# Patient Record
Sex: Female | Born: 1990 | Race: White | Hispanic: No | Marital: Single | Smoking: Former smoker
Health system: Southern US, Community
[De-identification: ages and names within clinical notes are randomized; demographics above are authoritative.]

## PROBLEM LIST (undated history)

## (undated) DIAGNOSIS — J45909 Unspecified asthma, uncomplicated: Secondary | ICD-10-CM

## (undated) DIAGNOSIS — F419 Anxiety disorder, unspecified: Secondary | ICD-10-CM

## (undated) HISTORY — DX: Anxiety disorder, unspecified: F41.9

---
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
## 2011-01-27 HISTORY — PX: WISDOM TOOTH EXTRACTION: SHX21

## 2011-07-10 ENCOUNTER — Encounter (HOSPITAL_COMMUNITY): Payer: Self-pay | Admitting: Emergency Medicine

## 2011-07-10 ENCOUNTER — Emergency Department (HOSPITAL_COMMUNITY): Payer: Self-pay

## 2011-07-10 ENCOUNTER — Emergency Department (HOSPITAL_COMMUNITY)
Admission: EM | Admit: 2011-07-10 | Discharge: 2011-07-11 | Disposition: A | Discharge status: Home / Self Care | Payer: Self-pay | Attending: Emergency Medicine | Admitting: Emergency Medicine

## 2011-07-10 DIAGNOSIS — R000 Tachycardia, unspecified: Secondary | ICD-10-CM | POA: Insufficient documentation

## 2011-07-10 DIAGNOSIS — J45901 Unspecified asthma with (acute) exacerbation: Secondary | ICD-10-CM

## 2011-07-10 DIAGNOSIS — J45909 Unspecified asthma, uncomplicated: Secondary | ICD-10-CM | POA: Insufficient documentation

## 2011-07-10 DIAGNOSIS — J40 Bronchitis, not specified as acute or chronic: Secondary | ICD-10-CM

## 2011-07-10 HISTORY — DX: Unspecified asthma, uncomplicated: J45.909

## 2011-07-10 MED ORDER — ALBUTEROL SULFATE (5 MG/ML) 0.5% IN NEBU
5.0000 mg | INHALATION_SOLUTION | Freq: Once | RESPIRATORY_TRACT | Status: AC
  Administered 2011-07-10: 5 mg via RESPIRATORY_TRACT
  Filled 2011-07-10: qty 1

## 2011-07-10 NOTE — ED Notes (Signed)
PT. REPORTS PERSISTENT SOB WITH PRODUCTIVE COUGH AND WHEEZING UNRELIEVED BY HOME NEBULIZER/ MDI TREATMENTS.

## 2011-07-10 NOTE — ED Notes (Signed)
Pt states sob decreases after sob.  Pt asked notify nurse first if sob increases.

## 2011-07-11 LAB — PREGNANCY, URINE: Preg Test, Ur: NEGATIVE

## 2011-07-11 MED ORDER — AZITHROMYCIN 250 MG PO TABS
500.0000 mg | ORAL_TABLET | Freq: Once | ORAL | Status: AC
  Administered 2011-07-11: 500 mg via ORAL
  Filled 2011-07-11: qty 2

## 2011-07-11 MED ORDER — ALBUTEROL SULFATE HFA 108 (90 BASE) MCG/ACT IN AERS
INHALATION_SPRAY | RESPIRATORY_TRACT | Status: AC
  Filled 2011-07-11: qty 6.7

## 2011-07-11 MED ORDER — ALBUTEROL SULFATE HFA 108 (90 BASE) MCG/ACT IN AERS
2.0000 | INHALATION_SPRAY | RESPIRATORY_TRACT | Status: DC | PRN
  Administered 2011-07-11: 2 via RESPIRATORY_TRACT

## 2011-07-11 MED ORDER — PREDNISONE 10 MG PO TABS: 20.0000 mg | ORAL_TABLET | Freq: Two times a day (BID) | ORAL | Status: DC

## 2011-07-11 MED ORDER — METHYLPREDNISOLONE SODIUM SUCC 125 MG IJ SOLR
125.0000 mg | Freq: Once | INTRAMUSCULAR | Status: AC
  Administered 2011-07-11: 125 mg via INTRAMUSCULAR
  Filled 2011-07-11: qty 2

## 2011-07-11 MED ORDER — AZITHROMYCIN 250 MG PO TABS: 250.0000 mg | ORAL_TABLET | Freq: Every day | ORAL | Status: AC

## 2011-07-11 NOTE — ED Provider Notes (Signed)
History     CSN: [id number]  Arrival date & time 07/10/11  2051   First MD Initiated Contact with Patient 07/11/11 0003      Chief Complaint  Patient presents with  . Shortness of Breath    (Consider location/radiation/quality/duration/timing/severity/associated sxs/prior treatment) HPI Comments: History of asthma, pneumonia.  No better with nebs at home.  Patient is a 21 y.o. female presenting with shortness of breath. The history is provided by the patient.  Shortness of Breath  The current episode started 2 days ago. The problem occurs continuously. The problem has been gradually worsening. The problem is moderate. Nothing relieves the symptoms. Nothing aggravates the symptoms. Associated symptoms include shortness of breath. Her past medical history is significant for asthma. Past medical history comments: bronchitis.    Past Medical History  Diagnosis Date  . Asthma     History reviewed. No pertinent past surgical history.  No family history on file.  History  Substance Use Topics  . Smoking status: Never Smoker   . Smokeless tobacco: Not on file  . Alcohol Use: No    OB History    Grav Para Term Preterm Abortions TAB SAB Ect Mult Living                  Review of Systems  Respiratory: Positive for shortness of breath.   All other systems reviewed and are negative.    Allergies  Review of patient's allergies indicates no known allergies.  Home Medications   Current Outpatient Rx  Name Route Sig Dispense Refill  . ALBUTEROL SULFATE HFA 108 (90 BASE) MCG/ACT IN AERS Inhalation Inhale 2 puffs into the lungs every 6 (six) hours as needed. For wheezing    . ALBUTEROL SULFATE (2.5 MG/3ML) 0.083% IN NEBU Nebulization Take 2.5 mg by nebulization every 6 (six) hours as needed. For wheezing      BP 157/83  Pulse 111  Temp 98.4 F (36.9 C) (Oral)  Resp 24  SpO2 97%  Physical Exam  Nursing note and vitals reviewed. Constitutional: She is oriented to  person, place, and time. She appears well-developed and well-nourished. No distress.  HENT:  Head: Normocephalic and atraumatic.  Mouth/Throat: Oropharynx is clear and moist.  Neck: Normal range of motion. Neck supple.  Cardiovascular: Regular rhythm and normal heart sounds.   No murmur heard.      Tachycardic, no murmurs.  Pulmonary/Chest:       Bilateral wheezing present.  Abdominal: Soft. Bowel sounds are normal. She exhibits no distension. There is no tenderness.  Musculoskeletal: She exhibits no edema.  Lymphadenopathy:    She has no cervical adenopathy.  Neurological: She is alert and oriented to person, place, and time.  Skin: Skin is warm and dry. She is not diaphoretic.    ED Course  Procedures (including critical care time)  Labs Reviewed - No data to display Dg Chest 2 View  07/10/2011  *RADIOLOGY REPORT*  Clinical Data: Shortness of breath, cough, and chest congestion. Chest pain.  CHEST - 2 VIEW  Comparison: 04/11/2009  Findings: Heart size and vascularity are normal.  There is chronic peribronchial thickening.  No infiltrates or effusions.  No pneumothorax. No osseous abnormality.  IMPRESSION: Chronic or recurrent bronchitic changes.  Original Report Authenticated By: Gwynn Burly, M.D.     No diagnosis found.   Date: 07/11/2011  Rate: 107  Rhythm: sinus tachycardia  QRS Axis: normal  Intervals: normal  ST/T Wave abnormalities: normal  Conduction Disutrbances:none  Narrative  Interpretation:   Old EKG Reviewed: none available    MDM  The patient presents with shortness of breath, cough.  She has a history of pneumonia/asthma.  She is feeling better with neb, solumedrol injection.  Will discharge to home with prednisone, zithromax, continued nebs.  Return prn.        Geoffery Lyons, MD 07/11/11 [phone]

## 2011-07-11 NOTE — Discharge Instructions (Signed)

## 2015-02-06 ENCOUNTER — Encounter (HOSPITAL_COMMUNITY): Payer: Self-pay

## 2015-02-06 ENCOUNTER — Emergency Department (HOSPITAL_COMMUNITY)
Admission: EM | Admit: 2015-02-06 | Discharge: 2015-02-06 | Disposition: A | Discharge status: Home / Self Care | Payer: Self-pay | Attending: Emergency Medicine | Admitting: Emergency Medicine

## 2015-02-06 ENCOUNTER — Emergency Department (HOSPITAL_COMMUNITY): Payer: Self-pay

## 2015-02-06 DIAGNOSIS — N760 Acute vaginitis: Secondary | ICD-10-CM | POA: Insufficient documentation

## 2015-02-06 DIAGNOSIS — J45901 Unspecified asthma with (acute) exacerbation: Secondary | ICD-10-CM | POA: Insufficient documentation

## 2015-02-06 DIAGNOSIS — R0782 Intercostal pain: Secondary | ICD-10-CM

## 2015-02-06 DIAGNOSIS — Z79899 Other long term (current) drug therapy: Secondary | ICD-10-CM | POA: Insufficient documentation

## 2015-02-06 DIAGNOSIS — M940 Chondrocostal junction syndrome [Tietze]: Secondary | ICD-10-CM | POA: Insufficient documentation

## 2015-02-06 DIAGNOSIS — B9689 Other specified bacterial agents as the cause of diseases classified elsewhere: Secondary | ICD-10-CM

## 2015-02-06 DIAGNOSIS — R110 Nausea: Secondary | ICD-10-CM | POA: Insufficient documentation

## 2015-02-06 LAB — I-STAT TROPONIN, ED
Troponin i, poc: 0 ng/mL (ref 0.00–0.08)
Troponin i, poc: 0.01 ng/mL (ref 0.00–0.08)

## 2015-02-06 LAB — BASIC METABOLIC PANEL
Anion gap: 8 (ref 5–15)
BUN: 11 mg/dL (ref 6–20)
CO2: 27 mmol/L (ref 22–32)
Calcium: 9.6 mg/dL (ref 8.9–10.3)
Chloride: 106 mmol/L (ref 101–111)
Creatinine, Ser: 0.85 mg/dL (ref 0.44–1.00)
GFR calc Af Amer: >60 mL/min (ref >60)
GFR calc non Af Amer: >60 mL/min (ref >60)
Glucose, Bld: 96 mg/dL (ref 65–99)
Potassium: 4.3 mmol/L (ref 3.5–5.1)
Sodium: 141 mmol/L (ref 135–145)

## 2015-02-06 LAB — CBC
HCT: 43.5 % (ref 36.0–46.0)
Hemoglobin: 14.3 g/dL (ref 12.0–15.0)
MCH: 31 pg (ref 26.0–34.0)
MCHC: 32.9 g/dL (ref 30.0–36.0)
MCV: 94.4 fL (ref 78.0–100.0)
Platelets: 312 10*3/uL (ref 150–400)
RBC: 4.61 MIL/uL (ref 3.87–5.11)
RDW: 12.5 % (ref 11.5–15.5)
WBC: 5.2 10*3/uL (ref 4.0–10.5)

## 2015-02-06 LAB — WET PREP, GENITAL
Sperm: NONE SEEN
Trich, Wet Prep: NONE SEEN
Yeast Wet Prep HPF POC: NONE SEEN

## 2015-02-06 MED ORDER — METRONIDAZOLE 500 MG PO TABS: 500.0000 mg | ORAL_TABLET | Freq: Two times a day (BID) | ORAL | Status: DC

## 2015-02-06 MED ORDER — KETOROLAC TROMETHAMINE 60 MG/2ML IM SOLN
60.0000 mg | Freq: Once | INTRAMUSCULAR | Status: AC
  Administered 2015-02-06: 60 mg via INTRAMUSCULAR
  Filled 2015-02-06: qty 2

## 2015-02-06 MED ORDER — IBUPROFEN 800 MG PO TABS: 800.0000 mg | ORAL_TABLET | Freq: Three times a day (TID) | ORAL | Status: DC | PRN

## 2015-02-06 NOTE — ED Notes (Signed)
Pt states chest pain since this morning.  Denies respiratory symptoms.  States pain is "really bad" when it occurs.  Some shortness of breath when pain occurs.

## 2015-02-06 NOTE — ED Provider Notes (Signed)
CSN: [id number]     Arrival date & time 02/06/15  1012 History   First MD Initiated Contact with Patient 02/06/15 1133     Chief Complaint  Patient presents with  . Chest Pain     (Consider location/radiation/quality/duration/timing/severity/associated sxs/prior Treatment) HPI Alisha Harris is a 25 year old white female with past medical history of asthma and migraines who presents to the ED today complaining of intermittent sharp sternal chest pain. It began at 7-8am. Pain is not dependent of position and it cannot be recreated with palpation. It is associated with mild dyspnea at onset, anxiety and nausea. Patient denies HA, dizziness, syncope, palpitations, pleuritic chest pain, hemoptysis.  Patient also comes in complaining of 2 month history of increased vaginal discharge. Discharge is white and thicker than normal. Patient has not tried anything to improve symptoms. She denies dysuria, irritation, burning, frequency, odor.  Past Medical History  Diagnosis Date  . Asthma    History reviewed. No pertinent past surgical history. History reviewed. No pertinent family history. Social History  Substance Use Topics  . Smoking status: Never Smoker   . Smokeless tobacco: None  . Alcohol Use: No   OB History    No data available     Review of Systems  Constitutional: Negative for fever, chills and fatigue.  Respiratory: Positive for shortness of breath and wheezing. Negative for cough.   Cardiovascular: Positive for chest pain. Negative for palpitations and leg swelling.  Gastrointestinal: Positive for nausea. Negative for vomiting, abdominal pain, diarrhea, constipation, blood in stool and abdominal distention.  Endocrine: Negative for polydipsia and polyuria.  Genitourinary: Positive for vaginal discharge. Negative for dysuria, urgency, frequency, hematuria, vaginal bleeding and difficulty urinating.  Skin: Negative for color change.  Neurological: Negative for dizziness,  syncope, weakness, light-headedness, numbness and headaches.      Allergies  Review of patient's allergies indicates no known allergies.  Home Medications   Prior to Admission medications   Medication Sig Start Date End Date Taking? Authorizing Provider  albuterol (PROVENTIL HFA;VENTOLIN HFA) 108 (90 Base) MCG/ACT inhaler Inhale 2 puffs into the lungs every 6 (six) hours as needed for wheezing or shortness of breath.   Yes Historical Provider, MD  predniSONE (DELTASONE) 10 MG tablet Take 2 tablets (20 mg total) by mouth 2 (two) times daily. Patient not taking: Reported on 02/06/2015 07/11/11   Geoffery Lyons, MD   BP 129/84 mmHg  Pulse 83  Temp(Src) 98 F (36.7 C) (Oral)  Resp 18  SpO2 96% Physical Exam  Constitutional: She is oriented to person, place, and time. She appears well-developed and well-nourished. No distress.  HENT:  Head: Normocephalic and atraumatic.  Mouth/Throat: Oropharynx is clear and moist.  Eyes: Pupils are equal, round, and reactive to light.  Neck: Normal range of motion. Neck supple.  Cardiovascular: Normal rate, regular rhythm, normal heart sounds and intact distal pulses.  Exam reveals no gallop and no friction rub.   No murmur heard. Pulmonary/Chest: Effort normal. She has wheezes. She exhibits tenderness.  Abdominal: Soft. Bowel sounds are normal. She exhibits no distension. There is no tenderness. There is no rebound and no guarding.  Genitourinary: Vaginal discharge found.  Moderate amount of thin white discharge.   Musculoskeletal: She exhibits no edema or tenderness.  Neurological: She is alert and oriented to person, place, and time. No sensory deficit. She exhibits normal muscle tone. Coordination normal.  Skin: Skin is warm and dry. No rash noted. No erythema.  Nursing note and vitals reviewed.  ED Course  Procedures (including critical care time) Labs Review Labs Reviewed  WET PREP, GENITAL - Abnormal; Notable for the following:    Clue  Cells Wet Prep HPF POC PRESENT (*)    WBC, Wet Prep HPF POC MANY (*)    All other components within normal limits  BASIC METABOLIC PANEL  CBC  I-STAT TROPOININ, ED  I-STAT TROPOININ, ED  GC/CHLAMYDIA PROBE AMP ([place]) NOT AT [facility]    Imaging Review Dg Chest 2 View  02/06/2015  CLINICAL DATA:  Chest pain EXAM: CHEST - 2 VIEW COMPARISON:  04/30/2013 FINDINGS: The heart size and mediastinal contours are within normal limits. Both lungs are clear. The visualized skeletal structures are unremarkable. IMPRESSION: No active disease. Electronically Signed   By: Alcide CleverMark  Lukens M.D.   On: 02/06/2015 10:46   I have personally reviewed and evaluated these images and lab results as part of my medical decision-making.   Patient be treated for costochondritis.  Told to return here as needed.  Patient agrees the plan and all questions were answered.  This diagnosis was based on her history of present illness and physical exam findings, along with the fact that she has palpable and reproducible pain that is also affected by certain positions.  Patient is PERC negative and low risk based on well's criteria.  She is advised follow-up with her primary care doctor.  She was rechecked 3     Charlestine Nighthristopher Jaella Weinert, PA-C 02/07/15 1558  Melene Planan Floyd, DO 02/07/15 1755

## 2015-02-06 NOTE — Discharge Instructions (Signed)
Return here as needed.  Follow-up with a primary care doctor.  Your testing here today did not show any abnormalities. °

## 2015-02-06 NOTE — ED Notes (Signed)
Called for labs form lobby no response.

## 2015-02-07 LAB — GC/CHLAMYDIA PROBE AMP (~~LOC~~) NOT AT ARMC
Chlamydia: NEGATIVE
Neisseria Gonorrhea: NEGATIVE

## 2015-05-10 ENCOUNTER — Telehealth (INDEPENDENT_AMBULATORY_CARE_PROVIDER_SITE_OTHER): Payer: Self-pay

## 2015-05-10 DIAGNOSIS — B9689 Other specified bacterial agents as the cause of diseases classified elsewhere: Secondary | ICD-10-CM

## 2015-05-10 DIAGNOSIS — J069 Acute upper respiratory infection, unspecified: Principal | ICD-10-CM

## 2015-05-12 MED ORDER — BENZONATATE 100 MG PO CAPS: 100.0000 mg | ORAL_CAPSULE | Freq: Two times a day (BID) | ORAL | Status: DC | PRN

## 2015-05-12 MED ORDER — PREDNISONE 5 MG (21) PO TBPK: 5.0000 mg | ORAL_TABLET | Freq: Every day | ORAL | Status: DC

## 2015-05-12 MED ORDER — AZITHROMYCIN 250 MG PO TABS: ORAL_TABLET | ORAL | Status: DC

## 2015-05-12 NOTE — Progress Notes (Signed)
We are sorry that you are not feeling well.  Here is how we plan to help!  Based on what you have shared with me it looks like you have upper respiratory tract inflammation that has resulted in a significant cough.  Inflammation and infection in the upper respiratory tract is commonly called bronchitis and has four common causes:  Allergies, Viral Infections, Acid Reflux and Bacterial Infections.  Allergies, viruses and acid reflux are treated by controlling symptoms or eliminating the cause. An example might be a cough caused by taking certain blood pressure medications. You stop the cough by changing the medication. Another example might be a cough caused by acid reflux. Controlling the reflux helps control the cough.  Based on your presentation I believe you most likely have A cough due to bacteria.  When patients have a fever and a productive cough with a change in color or increased sputum production, we are concerned about bacterial bronchitis.  If left untreated it can progress to pneumonia.  If your symptoms do not improve with your treatment plan it is important that you contact your provider.   I have prescribed Azithromyin 250 mg: two tables now and then one tablet daily for 4 additonal days   In addition you may use A non-prescription cough medication called Mucinex DM: take 2 tablets every 12 hours. and A prescription cough medication called Tessalon Perles 100mg . You may take 1-2 capsules every 8 hours as needed for your cough.   Additionally, due to your inhalers not working well, I have prescribed an Omnipred prednisone dose pak (As directed).     HOME CARE . Only take medications as instructed by your medical team. . Complete the entire course of an antibiotic. . Drink plenty of fluids and get plenty of rest. . Avoid close contacts especially the very young and the elderly . Cover your mouth if you cough or cough into your sleeve. . Always remember to wash your hands . A steam or  ultrasonic humidifier can help congestion.    GET HELP RIGHT AWAY IF: . You develop worsening fever. . You become short of breath . You cough up blood. . Your symptoms persist after you have completed your treatment plan MAKE SURE YOU   Understand these instructions.  Will watch your condition.  Will get help right away if you are not doing well or get worse.  Your e-visit answers were reviewed by a board certified advanced clinical practitioner to complete your personal care plan.  Depending on the condition, your plan could have included both over the counter or prescription medications. If there is a problem please reply  once you have received a response from your provider. Your safety is important to us.  If you have drug allergies check your prescription carefully.    You can use MyChart to ask questions about today's visit, request a non-urgent call back, or ask for a work or school excuse for 24 hours related to this e-Visit. If it has been greater than 24 hours you will need to follow up with your provider, or enter a new e-Visit to address those concerns. You will get an e-mail in the next two days asking about your experience.  I hope that your e-visit has been valuable and will speed your recovery. Thank you for using e-visits.

## 2015-05-23 ENCOUNTER — Telehealth: Payer: Self-pay | Admitting: Physician Assistant

## 2015-05-23 DIAGNOSIS — J45909 Unspecified asthma, uncomplicated: Secondary | ICD-10-CM

## 2015-05-23 DIAGNOSIS — J069 Acute upper respiratory infection, unspecified: Secondary | ICD-10-CM

## 2015-05-23 NOTE — Progress Notes (Signed)
Based on what you shared with me it looks like you have a condition that should be evaluated in a face to face office visit. Giving you recently finished treatment for bronchitis with breathing difficulty, you need to be seen in office for a good examination/assessment to make sure the appropriate treatment is given. Continue the supportive measures recommended at last e-visit and schedule an appointment to see your prrimary care provider or go to an Urgent Care.  If you are having a true medical emergency please call 911.  If you need an urgent face to face visit, [facility] has four urgent care centers for your convenience.  Tressie Ellis. [facility]  [phone] Get Driving Directions Find a Provider at this Location  [address] [place], [place] [address] . 8 am to 8 pm Monday-Friday . 9 am to 7 pm Saturday-Sunday  . [facility]  [phone] Get Driving Directions Find a Provider at this Location  [address] [place], [place] [address] . 8 am to 8 pm Monday-Friday . 9 am to 6 pm Saturday . 11 am to 6 pm Sunday   . [facility]  [phone] Get Driving Directions  [address] [place], [place] [address] . 8 am to 8 pm Monday-Friday . 9 am to 4 pm Saturday-Sunday   . [facility] (a walk in primary care provider)  [phone]  Get Driving Directions Find a Provider at this Location  [address] [place], [place] [address] . 8 am to 8:30 pm Monday-Thursday . 8 am to 6 pm Friday . 8 am to 4 pm Saturday-Sunday   Your e-visit answers were reviewed by a board certified advanced clinical practitioner to complete your personal care plan.  Thank you for using e-Visits.

## 2015-07-08 ENCOUNTER — Other Ambulatory Visit: Payer: Self-pay | Admitting: Family

## 2015-07-08 ENCOUNTER — Telehealth: Payer: Self-pay | Admitting: Family

## 2015-07-08 DIAGNOSIS — R05 Cough: Secondary | ICD-10-CM

## 2015-07-08 DIAGNOSIS — J309 Allergic rhinitis, unspecified: Secondary | ICD-10-CM

## 2015-07-08 DIAGNOSIS — J4530 Mild persistent asthma, uncomplicated: Secondary | ICD-10-CM

## 2015-07-08 DIAGNOSIS — R059 Cough, unspecified: Secondary | ICD-10-CM

## 2015-07-08 MED ORDER — BENZONATATE 100 MG PO CAPS: 100.0000 mg | ORAL_CAPSULE | Freq: Two times a day (BID) | ORAL | Status: DC | PRN

## 2015-07-08 MED ORDER — PREDNISONE 5 MG (21) PO TBPK: 5.0000 mg | ORAL_TABLET | Freq: Every day | ORAL | Status: DC

## 2015-07-08 NOTE — Progress Notes (Signed)
We are sorry that you are not feeling well.  Here is how we plan to help!  Based on what you have shared with me it looks like you have upper respiratory tract inflammation that has resulted in a significant cough.  Inflammation and infection in the upper respiratory tract is commonly called bronchitis and has four common causes:  Allergies, Viral Infections, Acid Reflux and Bacterial Infections.  Allergies, viruses and acid reflux are treated by controlling symptoms or eliminating the cause. An example might be a cough caused by taking certain blood pressure medications. You stop the cough by changing the medication. Another example might be a cough caused by acid reflux. Controlling the reflux helps control the cough.  Based on your presentation I believe you most likely have A cough due to allergies.  I recommend that you start the an over-the counter-allergy medication such as Claritin 10 mg or Zyrtec 10 mg daily.    In addition you may use A non-prescription cough medication called Robitussin DAC. Take 2 teaspoons every 8 hours or Delsym: take 2 teaspoons every 12 hours., A non-prescription cough medication called Mucinex DM: take 2 tablets every 12 hours. and A prescription cough medication called Tessalon Perles 100mg . You may take 1-2 capsules every 8 hours as needed for your cough. I have also sent you in a steroid dose pack.   It also seems as if you need to follow up with your primary care provider and be tested for asthma?    HOME CARE . Only take medications as instructed by your medical team. . Complete the entire course of an antibiotic. . Drink plenty of fluids and get plenty of rest. . Avoid close contacts especially the very young and the elderly . Cover your mouth if you cough or cough into your sleeve. . Always remember to wash your hands . A steam or ultrasonic humidifier can help congestion.    GET HELP RIGHT AWAY IF: . You develop worsening fever. . You become short of  breath . You cough up blood. . Your symptoms persist after you have completed your treatment plan MAKE SURE YOU   Understand these instructions.  Will watch your condition.  Will get help right away if you are not doing well or get worse.  Your e-visit answers were reviewed by a board certified advanced clinical practitioner to complete your personal care plan.  Depending on the condition, your plan could have included both over the counter or prescription medications. If there is a problem please reply  once you have received a response from your provider. Your safety is important to us.  If you have drug allergies check your prescription carefully.    You can use MyChart to ask questions about today's visit, request a non-urgent call back, or ask for a work or school excuse for 24 hours related to this e-Visit. If it has been greater than 24 hours you will need to follow up with your provider, or enter a new e-Visit to address those concerns. You will get an e-mail in the next two days asking about your experience.  I hope that your e-visit has been valuable and will speed your recovery. Thank you for using e-visits.

## 2015-10-14 ENCOUNTER — Telehealth: Payer: Self-pay | Admitting: Family

## 2015-10-14 DIAGNOSIS — B9689 Other specified bacterial agents as the cause of diseases classified elsewhere: Secondary | ICD-10-CM

## 2015-10-14 DIAGNOSIS — J029 Acute pharyngitis, unspecified: Secondary | ICD-10-CM

## 2015-10-14 DIAGNOSIS — J028 Acute pharyngitis due to other specified organisms: Principal | ICD-10-CM

## 2015-10-14 DIAGNOSIS — J4530 Mild persistent asthma, uncomplicated: Secondary | ICD-10-CM

## 2015-10-14 MED ORDER — BENZONATATE 100 MG PO CAPS: 100.0000 mg | ORAL_CAPSULE | Freq: Three times a day (TID) | ORAL | 0 refills | Status: DC | PRN

## 2015-10-14 MED ORDER — AZITHROMYCIN 250 MG PO TABS: ORAL_TABLET | ORAL | 0 refills | Status: DC

## 2015-10-14 MED ORDER — PREDNISONE 5 MG PO TABS: 5.0000 mg | ORAL_TABLET | Freq: Every day | ORAL | 0 refills | Status: DC

## 2015-10-14 NOTE — Progress Notes (Signed)
We are sorry that you are not feeling well.  Here is how we plan to help!  Based on what you have shared with me it looks like you have upper respiratory tract inflammation that has resulted in a significant cough.  Inflammation and infection in the upper respiratory tract is commonly called bronchitis and has four common causes:  Allergies, Viral Infections, Acid Reflux and Bacterial Infections.  Allergies, viruses and acid reflux are treated by controlling symptoms or eliminating the cause. An example might be a cough caused by taking certain blood pressure medications. You stop the cough by changing the medication. Another example might be a cough caused by acid reflux. Controlling the reflux helps control the cough.  Based on your presentation I believe you most likely have A cough due to bacteria.  When patients have a fever and a productive cough with a change in color or increased sputum production, we are concerned about bacterial bronchitis.  If left untreated it can progress to pneumonia.  If your symptoms do not improve with your treatment plan it is important that you contact your provider.   I have prescribed Azithromyin 250 mg: two tables now and then one tablet daily for 4 additonal days   In addition you may use A non-prescription cough medication called Mucinex DM: take 2 tablets every 12 hours. and A prescription cough medication called Tessalon Perles 100mg . You may take 1-2 capsules every 8 hours as needed for your cough.  I have also prescribed the prednisone pack due to your history of needing extra support with your inhaler.    HOME CARE . Only take medications as instructed by your medical team. . Complete the entire course of an antibiotic. . Drink plenty of fluids and get plenty of rest. . Avoid close contacts especially the very young and the elderly . Cover your mouth if you cough or cough into your sleeve. . Always remember to wash your hands . A steam or ultrasonic  humidifier can help congestion.    GET HELP RIGHT AWAY IF: . You develop worsening fever. . You become short of breath . You cough up blood. . Your symptoms persist after you have completed your treatment plan MAKE SURE YOU   Understand these instructions.  Will watch your condition.  Will get help right away if you are not doing well or get worse.  Your e-visit answers were reviewed by a board certified advanced clinical practitioner to complete your personal care plan.  Depending on the condition, your plan could have included both over the counter or prescription medications. If there is a problem please reply  once you have received a response from your provider. Your safety is important to us.  If you have drug allergies check your prescription carefully.    You can use MyChart to ask questions about today's visit, request a non-urgent call back, or ask for a work or school excuse for 24 hours related to this e-Visit. If it has been greater than 24 hours you will need to follow up with your provider, or enter a new e-Visit to address those concerns. You will get an e-mail in the next two days asking about your experience.  I hope that your e-visit has been valuable and will speed your recovery. Thank you for using e-visits.

## 2016-01-01 ENCOUNTER — Telehealth: Payer: Self-pay | Admitting: Family

## 2016-01-01 ENCOUNTER — Other Ambulatory Visit: Payer: Self-pay | Admitting: Family

## 2016-01-01 DIAGNOSIS — J069 Acute upper respiratory infection, unspecified: Secondary | ICD-10-CM

## 2016-01-01 DIAGNOSIS — J028 Acute pharyngitis due to other specified organisms: Principal | ICD-10-CM

## 2016-01-01 DIAGNOSIS — B9789 Other viral agents as the cause of diseases classified elsewhere: Secondary | ICD-10-CM

## 2016-01-01 DIAGNOSIS — B9689 Other specified bacterial agents as the cause of diseases classified elsewhere: Secondary | ICD-10-CM

## 2016-01-01 MED ORDER — BENZONATATE 100 MG PO CAPS: 100.0000 mg | ORAL_CAPSULE | Freq: Three times a day (TID) | ORAL | 0 refills | Status: DC | PRN

## 2016-01-01 NOTE — Progress Notes (Signed)
We are sorry that you are not feeling well.  Here is how we plan to help!  Based on what you have shared with me it looks like you have upper respiratory tract inflammation that has resulted in a significant cough.  Inflammation and infection in the upper respiratory tract is commonly called bronchitis and has four common causes:  Allergies, Viral Infections, Acid Reflux and Bacterial Infections.  Allergies, viruses and acid reflux are treated by controlling symptoms or eliminating the cause. An example might be a cough caused by taking certain blood pressure medications. You stop the cough by changing the medication. Another example might be a cough caused by acid reflux. Controlling the reflux helps control the cough.  Based on your presentation I believe you most likely have A cough due to a virus.  This is called viral bronchitis and is best treated by rest, plenty of fluids and control of the cough.  You may use Ibuprofen or Tylenol as directed to help your symptoms.     In addition you may use A non-prescription cough medication called Robitussin DAC. Take 2 teaspoons every 8 hours or Delsym: take 2 teaspoons every 12 hours., A non-prescription cough medication called Mucinex DM: take 2 tablets every 12 hours. and A prescription cough medication called Tessalon Perles 100mg. You may take 1-2 capsules every 8 hours as needed for your cough.   USE OF BRONCHODILATOR ("RESCUE") INHALERS: There is a risk from using your bronchodilator too frequently.  The risk is that over-reliance on a medication which only relaxes the muscles surrounding the breathing tubes can reduce the effectiveness of medications prescribed to reduce swelling and congestion of the tubes themselves.  Although you feel brief relief from the bronchodilator inhaler, your asthma may actually be worsening with the tubes becoming more swollen and filled with mucus.  This can delay other crucial treatments, such as oral steroid medications.  If you need to use a bronchodilator inhaler daily, several times per day, you should discuss this with your provider.  There are probably better treatments that could be used to keep your asthma under control.     HOME CARE . Only take medications as instructed by your medical team. . Complete the entire course of an antibiotic. . Drink plenty of fluids and get plenty of rest. . Avoid close contacts especially the very young and the elderly . Cover your mouth if you cough or cough into your sleeve. . Always remember to wash your hands . A steam or ultrasonic humidifier can help congestion.   GET HELP RIGHT AWAY IF: . You develop worsening fever. . You become short of breath . You cough up blood. . Your symptoms persist after you have completed your treatment plan MAKE SURE YOU   Understand these instructions.  Will watch your condition.  Will get help right away if you are not doing well or get worse.  Your e-visit answers were reviewed by a board certified advanced clinical practitioner to complete your personal care plan.  Depending on the condition, your plan could have included both over the counter or prescription medications. If there is a problem please reply  once you have received a response from your provider. Your safety is important to us.  If you have drug allergies check your prescription carefully.    You can use MyChart to ask questions about today's visit, request a non-urgent call back, or ask for a work or school excuse for 24 hours related to this e-Visit.   If it has been greater than 24 hours you will need to follow up with your provider, or enter a new e-Visit to address those concerns. You will get an e-mail in the next two days asking about your experience.  I hope that your e-visit has been valuable and will speed your recovery. Thank you for using e-visits.   

## 2016-01-06 ENCOUNTER — Other Ambulatory Visit: Payer: Self-pay | Admitting: Family

## 2016-01-06 DIAGNOSIS — B9689 Other specified bacterial agents as the cause of diseases classified elsewhere: Secondary | ICD-10-CM

## 2016-01-06 DIAGNOSIS — J069 Acute upper respiratory infection, unspecified: Principal | ICD-10-CM

## 2016-01-06 MED ORDER — AZITHROMYCIN 250 MG PO TABS: ORAL_TABLET | ORAL | 0 refills | Status: DC

## 2016-06-03 ENCOUNTER — Telehealth: Payer: Self-pay | Admitting: Family

## 2016-06-03 DIAGNOSIS — J069 Acute upper respiratory infection, unspecified: Secondary | ICD-10-CM

## 2016-06-03 DIAGNOSIS — B9789 Other viral agents as the cause of diseases classified elsewhere: Secondary | ICD-10-CM

## 2016-06-03 MED ORDER — ALBUTEROL SULFATE HFA 108 (90 BASE) MCG/ACT IN AERS: 2.0000 | INHALATION_SPRAY | Freq: Four times a day (QID) | RESPIRATORY_TRACT | 0 refills | Status: DC | PRN

## 2016-06-03 MED ORDER — PREDNISONE 10 MG (21) PO TBPK: ORAL_TABLET | ORAL | 0 refills | Status: DC

## 2016-06-03 MED ORDER — BENZONATATE 100 MG PO CAPS: 100.0000 mg | ORAL_CAPSULE | Freq: Three times a day (TID) | ORAL | 0 refills | Status: DC | PRN

## 2016-06-03 NOTE — Progress Notes (Signed)
We are sorry that you are not feeling well.  Here is how we plan to help!  Based on what you have shared with me it looks like you have upper respiratory tract inflammation that has resulted in a significant cough.  Inflammation and infection in the upper respiratory tract is commonly called bronchitis and has four common causes:  Allergies, Viral Infections, Acid Reflux and Bacterial Infections.  Allergies, viruses and acid reflux are treated by controlling symptoms or eliminating the cause. An example might be a cough caused by taking certain blood pressure medications. You stop the cough by changing the medication. Another example might be a cough caused by acid reflux. Controlling the reflux helps control the cough.  Based on your presentation I believe you most likely have A cough due to a virus.  This is called viral bronchitis and is best treated by rest, plenty of fluids and control of the cough.  You may use Ibuprofen or Tylenol as directed to help your symptoms.     In addition you may use A non-prescription cough medication called Robitussin DAC. Take 2 teaspoons every 8 hours or Delsym: take 2 teaspoons every 12 hours., A non-prescription cough medication called Mucinex DM: take 2 tablets every 12 hours. and A prescription cough medication called Tessalon Perles 100mg . You may take 1-2 capsules every 8 hours as needed for your cough.  Sterapred 10 mg dosepak and I sent in a refill of your albuterol inhaler.   USE OF BRONCHODILATOR ("RESCUE") INHALERS: There is a risk from using your bronchodilator too frequently.  The risk is that over-reliance on a medication which only relaxes the muscles surrounding the breathing tubes can reduce the effectiveness of medications prescribed to reduce swelling and congestion of the tubes themselves.  Although you feel brief relief from the bronchodilator inhaler, your asthma may actually be worsening with the tubes becoming more swollen and filled with mucus.   This can delay other crucial treatments, such as oral steroid medications. If you need to use a bronchodilator inhaler daily, several times per day, you should discuss this with your provider.  There are probably better treatments that could be used to keep your asthma under control.     HOME CARE . Only take medications as instructed by your medical team. . Complete the entire course of an antibiotic. . Drink plenty of fluids and get plenty of rest. . Avoid close contacts especially the very young and the elderly . Cover your mouth if you cough or cough into your sleeve. . Always remember to wash your hands . A steam or ultrasonic humidifier can help congestion.   GET HELP RIGHT AWAY IF: . You develop worsening fever. . You become short of breath . You cough up blood. . Your symptoms persist after you have completed your treatment plan MAKE SURE YOU   Understand these instructions.  Will watch your condition.  Will get help right away if you are not doing well or get worse.  Your e-visit answers were reviewed by a board certified advanced clinical practitioner to complete your personal care plan.  Depending on the condition, your plan could have included both over the counter or prescription medications. If there is a problem please reply  once you have received a response from your provider. Your safety is important to us.  If you have drug allergies check your prescription carefully.    You can use MyChart to ask questions about today's visit, request a non-urgent call back,  or ask for a work or school excuse for 24 hours related to this e-Visit. If it has been greater than 24 hours you will need to follow up with your provider, or enter a new e-Visit to address those concerns. You will get an e-mail in the next two days asking about your experience.  I hope that your e-visit has been valuable and will speed your recovery. Thank you for using e-visits.

## 2016-09-18 ENCOUNTER — Telehealth: Payer: Self-pay | Admitting: Family

## 2016-09-18 DIAGNOSIS — J028 Acute pharyngitis due to other specified organisms: Secondary | ICD-10-CM

## 2016-09-18 DIAGNOSIS — B9789 Other viral agents as the cause of diseases classified elsewhere: Secondary | ICD-10-CM

## 2016-09-18 DIAGNOSIS — J069 Acute upper respiratory infection, unspecified: Secondary | ICD-10-CM

## 2016-09-18 DIAGNOSIS — B9689 Other specified bacterial agents as the cause of diseases classified elsewhere: Secondary | ICD-10-CM

## 2016-09-18 MED ORDER — BENZONATATE 100 MG PO CAPS: 100.0000 mg | ORAL_CAPSULE | Freq: Three times a day (TID) | ORAL | 0 refills | Status: DC | PRN

## 2016-09-18 MED ORDER — AZITHROMYCIN 250 MG PO TABS: ORAL_TABLET | ORAL | 0 refills | Status: DC

## 2016-09-18 MED ORDER — PREDNISONE 5 MG PO TABS: 5.0000 mg | ORAL_TABLET | ORAL | 0 refills | Status: DC

## 2016-09-18 NOTE — Progress Notes (Signed)
Thank you for the details you put in the comment boxes. Those details really help us take better care of you.   We are sorry that you are not feeling well.  Here is how we plan to help!  Based on your presentation I believe you most likely have A cough due to bacteria.  When patients have a fever and a productive cough with a change in color or increased sputum production, we are concerned about bacterial bronchitis.  If left untreated it can progress to pneumonia.  If your symptoms do not improve with your treatment plan it is important that you contact your provider.   I have prescribed Azithromyin 250 mg: two tables now and then one tablet daily for 4 additonal days    In addition you may use A non-prescription cough medication called Mucinex DM: take 2 tablets every 12 hours. and A prescription cough medication called Tessalon Perles 100mg. You may take 1-2 capsules every 8 hours as needed for your cough.  Sterapred 5 mg dosepak  From your responses in the eVisit questionnaire you describe inflammation in the upper respiratory tract which is causing a significant cough.  This is commonly called Bronchitis and has four common causes:    Allergies  Viral Infections  Acid Reflux  Bacterial Infection Allergies, viruses and acid reflux are treated by controlling symptoms or eliminating the cause. An example might be a cough caused by taking certain blood pressure medications. You stop the cough by changing the medication. Another example might be a cough caused by acid reflux. Controlling the reflux helps control the cough.  USE OF BRONCHODILATOR ("RESCUE") INHALERS: There is a risk from using your bronchodilator too frequently.  The risk is that over-reliance on a medication which only relaxes the muscles surrounding the breathing tubes can reduce the effectiveness of medications prescribed to reduce swelling and congestion of the tubes themselves.  Although you feel brief relief from the  bronchodilator inhaler, your asthma may actually be worsening with the tubes becoming more swollen and filled with mucus.  This can delay other crucial treatments, such as oral steroid medications. If you need to use a bronchodilator inhaler daily, several times per day, you should discuss this with your provider.  There are probably better treatments that could be used to keep your asthma under control.     HOME CARE . Only take medications as instructed by your medical team. . Complete the entire course of an antibiotic. . Drink plenty of fluids and get plenty of rest. . Avoid close contacts especially the very young and the elderly . Cover your mouth if you cough or cough into your sleeve. . Always remember to wash your hands . A steam or ultrasonic humidifier can help congestion.   GET HELP RIGHT AWAY IF: . You develop worsening fever. . You become short of breath . You cough up blood. . Your symptoms persist after you have completed your treatment plan MAKE SURE YOU   Understand these instructions.  Will watch your condition.  Will get help right away if you are not doing well or get worse.  Your e-visit answers were reviewed by a board certified advanced clinical practitioner to complete your personal care plan.  Depending on the condition, your plan could have included both over the counter or prescription medications. If there is a problem please reply  once you have received a response from your provider. Your safety is important to us.  If you have drug allergies   check your prescription carefully.    You can use MyChart to ask questions about today's visit, request a non-urgent call back, or ask for a work or school excuse for 24 hours related to this e-Visit. If it has been greater than 24 hours you will need to follow up with your provider, or enter a new e-Visit to address those concerns. You will get an e-mail in the next two days asking about your experience.  I hope that  your e-visit has been valuable and will speed your recovery. Thank you for using e-visits.   

## 2016-11-05 ENCOUNTER — Telehealth: Payer: Self-pay | Admitting: Family

## 2016-11-05 DIAGNOSIS — J028 Acute pharyngitis due to other specified organisms: Secondary | ICD-10-CM

## 2016-11-05 DIAGNOSIS — B9789 Other viral agents as the cause of diseases classified elsewhere: Secondary | ICD-10-CM

## 2016-11-05 DIAGNOSIS — J069 Acute upper respiratory infection, unspecified: Secondary | ICD-10-CM

## 2016-11-05 DIAGNOSIS — B9689 Other specified bacterial agents as the cause of diseases classified elsewhere: Secondary | ICD-10-CM

## 2016-11-05 MED ORDER — ALBUTEROL SULFATE HFA 108 (90 BASE) MCG/ACT IN AERS: 2.0000 | INHALATION_SPRAY | Freq: Four times a day (QID) | RESPIRATORY_TRACT | 1 refills | Status: DC | PRN

## 2016-11-05 MED ORDER — BENZONATATE 100 MG PO CAPS: 100.0000 mg | ORAL_CAPSULE | Freq: Three times a day (TID) | ORAL | 0 refills | Status: DC | PRN

## 2016-11-05 MED ORDER — AZITHROMYCIN 250 MG PO TABS: ORAL_TABLET | ORAL | 0 refills | Status: DC

## 2016-11-05 NOTE — Progress Notes (Signed)
Thank you for the details you included in the comment boxes. Those details are very helpful in determining the best course of treatment for you and help [place] to provide the best care.  We are sorry that you are not feeling well.  Here is how we plan to help!  Based on your presentation I believe you most likely have A cough due to bacteria.  When patients have a fever and a productive cough with a change in color or increased sputum production, we are concerned about bacterial bronchitis.  If left untreated it can progress to pneumonia.  If your symptoms do not improve with your treatment plan it is important that you contact your provider.   I have prescribed Azithromyin 250 mg: two tablets now and then one tablet daily for 4 additonal days    In addition you may use A non-prescription cough medication called Mucinex DM: take 2 tablets every 12 hours. and A prescription cough medication called Tessalon Perles . You may take 1-2 capsules every 8 hours as needed for your cough.  I have also refilled your inhaler.   From your responses in the eVisit questionnaire you describe inflammation in the upper respiratory tract which is causing a significant cough.  This is commonly called Bronchitis and has four common causes:    Allergies  Viral Infections  Acid Reflux  Bacterial Infection Allergies, viruses and acid reflux are treated by controlling symptoms or eliminating the cause. An example might be a cough caused by taking certain blood pressure medications. You stop the cough by changing the medication. Another example might be a cough caused by acid reflux. Controlling the reflux helps control the cough.  USE OF BRONCHODILATOR ("RESCUE") INHALERS: There is a risk from using your bronchodilator too frequently.  The risk is that over-reliance on a medication which only relaxes the muscles surrounding the breathing tubes can reduce the effectiveness of medications prescribed to reduce swelling  and congestion of the tubes themselves.  Although you feel brief relief from the bronchodilator inhaler, your asthma may actually be worsening with the tubes becoming more swollen and filled with mucus.  This can delay other crucial treatments, such as oral steroid medications. If you need to use a bronchodilator inhaler daily, several times per day, you should discuss this with your provider.  There are probably better treatments that could be used to keep your asthma under control.     HOME CARE . Only take medications as instructed by your medical team. . Complete the entire course of an antibiotic. . Drink plenty of fluids and get plenty of rest. . Avoid close contacts especially the very young and the elderly . Cover your mouth if you cough or cough into your sleeve. . Always remember to wash your hands . A steam or ultrasonic humidifier can help congestion.   GET HELP RIGHT AWAY IF: . You develop worsening fever. . You become short of breath . You cough up blood. . Your symptoms persist after you have completed your treatment plan MAKE SURE YOU   Understand these instructions.  Will watch your condition.  Will get help right away if you are not doing well or get worse.  Your e-visit answers were reviewed by a board certified advanced clinical practitioner to complete your personal care plan.  Depending on the condition, your plan could have included both over the counter or prescription medications. If there is a problem please reply  once you have received a response from  your provider. Your safety is important to [place].  If you have drug allergies check your prescription carefully.    You can use MyChart to ask questions about today's visit, request a non-urgent call back, or ask for a work or school excuse for 24 hours related to this e-Visit. If it has been greater than 24 hours you will need to follow up with your provider, or enter a new e-Visit to address those concerns. You  will get an e-mail in the next two days asking about your experience.  I hope that your e-visit has been valuable and will speed your recovery. Thank you for using e-visits.

## 2016-11-09 NOTE — Progress Notes (Signed)
ADDENDUM:  Ms. Robertshaw,  I am calling you to share this information as well. If you are struggling to breathe, please be seen face-to-face at Urgent Care or the ED immediately so that they can take care of you and potentially administer other treatments. This is beyond the scope of the E-Visit program at this point. If you are in distress, please call 911.   The inhaler I sent was a refill of your Ventolin/Pro-Air inhaler, the same one you have had. This shows it was ordered at the pharmacy and if you were given something else, the pharmacy made an error.   Constance Haw, DNP, FNP-BC [facility] E-Visit Team

## 2016-11-09 NOTE — Progress Notes (Signed)
Called the patient and voicemail not set up. Could not contact patient.

## 2017-01-27 ENCOUNTER — Ambulatory Visit
Admission: RE | Admit: 2017-01-27 | Discharge: 2017-01-27 | Disposition: A | Discharge status: Home / Self Care | Payer: Self-pay | Source: Ambulatory Visit | Attending: Physician Assistant | Admitting: Physician Assistant

## 2017-01-27 ENCOUNTER — Other Ambulatory Visit: Payer: Self-pay | Admitting: Physician Assistant

## 2017-01-27 DIAGNOSIS — R053 Chronic cough: Secondary | ICD-10-CM

## 2017-01-27 DIAGNOSIS — R05 Cough: Secondary | ICD-10-CM

## 2017-11-04 ENCOUNTER — Encounter: Payer: Self-pay | Admitting: Gastroenterology

## 2017-11-10 ENCOUNTER — Encounter: Payer: Self-pay | Admitting: Gastroenterology

## 2017-11-10 ENCOUNTER — Ambulatory Visit (INDEPENDENT_AMBULATORY_CARE_PROVIDER_SITE_OTHER): Payer: 59 | Admitting: Gastroenterology

## 2017-11-10 VITALS — BP 124/76 | HR 116 | Ht 65.0 in | Wt 238.5 lb

## 2017-11-10 DIAGNOSIS — K5900 Constipation, unspecified: Secondary | ICD-10-CM | POA: Diagnosis not present

## 2017-11-10 DIAGNOSIS — K921 Melena: Secondary | ICD-10-CM

## 2017-11-10 NOTE — Patient Instructions (Signed)
If you are age 29 or older, your body mass index should be between 23-30. Your Body mass index is 39.69 kg/m. If this is out of the aforementioned range listed, please consider follow up with your Primary Care Provider.  If you are age 3 or younger, your body mass index should be between 19-25. Your Body mass index is 39.69 kg/m. If this is out of the aformentioned range listed, please consider follow up with your Primary Care Provider.   Please purchase the following medications over the counter and take as directed: Colace 100mg  by mouth twice daily.  Please start taking citrucel (orange flavored) powder fiber supplement.  This may cause some bloating at first but that usually goes away. Begin with a small spoonful and work your way up to a large, heaping spoonful daily over a week.  Fiber Chart  You should be consuming 25-30g of fiber per day and drinking 8 glasses of water to help your bowels move regularly.  In the chart below you can look up how much fiber you are getting in an average day.  If you are not getting enough fiber, you should add a fiber supplement to your diet.  Examples of this include Metamucil, FiberCon and Citrucel.  These can be purchased at your local grocery store or pharmacy.   It was a pleasure to see you today!  Vito Cirigliano, D.O.     [email or web address]

## 2017-11-10 NOTE — Progress Notes (Signed)
Chief Complaint: Hematochezia   Referring Provider:     Roger Kill    HPI:     Alisha Harris is a 27 y.o. female referred to the [facility] for evaluation of hematochezia.  She states she has had hematochezia since 11/03/17. Went to her PCM on 10/11 and reports exam negative for hemorrhoids or fissure. No prior similar sxs. Describes BRB on tissue paper and in toilet water. No dyschezia. Prior to onset, has been having only 1-2 BM/week, from baseline of 1 BM/daily previously. Does have some associated abdominal cramping which resolves with BM. No n/v/f/c. Patient is otherwise without preceding exposures to include recent antibiotics, hospitalization, sick contacts, travel and denies new medications, supplements, OTCs. No previous colonoscopy or EGD.   She was recently seen by her PCM, Dr. Mayford Knife at [facility], on 11/05/2017.  Unfortunately, due to computer errors, unable to see these records.  She reports that labs were collected.  We will request for these records today. No abdominal imaging for review today.  No known family history of CRC, GI malignancy, liver disease, pancreatic disease, or IBD.    Past Medical History:  Diagnosis Date  . Anxiety   . Asthma      Past Surgical History:  Procedure Laterality Date  . WISDOM TOOTH EXTRACTION  2013   just 3 was removed    Family History  Problem Relation Age of Onset  . Ovarian cancer Mother   . Diabetes Maternal Grandfather   . Pulmonary fibrosis Maternal Grandfather   . Diabetes Paternal Grandmother   . Diabetes Paternal Grandfather   . Colon cancer Neg Hx   . Esophageal cancer Neg Hx   . Breast cancer Neg Hx    Social History   Tobacco Use  . Smoking status: Former Games developer  . Smokeless tobacco: Never Used  . Tobacco comment: did it here and there. stopped around 16  Substance Use Topics  . Alcohol use: No  . Drug use: No   Current Outpatient Medications    Medication Sig Dispense Refill  . albuterol (PROVENTIL HFA;VENTOLIN HFA) 108 (90 Base) MCG/ACT inhaler Inhale 2 puffs into the lungs every 6 (six) hours as needed for wheezing or shortness of breath. 1 Inhaler 1  . amphetamine-dextroamphetamine (ADDERALL) 20 MG tablet Take by mouth.    Marland Kitchen buPROPion (WELLBUTRIN XL) 150 MG 24 hr tablet Take by mouth.    . cetirizine (ZYRTEC) 10 MG tablet Take by mouth.    . mometasone (NASONEX) 50 MCG/ACT nasal spray as needed.     No current facility-administered medications for this visit.    No Known Allergies   Review of Systems: All systems reviewed and negative except where noted in HPI.     Physical Exam:    Wt Readings from Last 3 Encounters:  11/10/17 238 lb 8 oz (108.2 kg)    BP 124/76   Pulse (!) 116   Ht 5\' 5"  (1.651 m)   Wt 238 lb 8 oz (108.2 kg)   BMI 39.69 kg/m  Constitutional:  Pleasant, in no acute distress. Psychiatric: Normal mood and affect. Behavior is normal. EENT: Pupils normal.  Conjunctivae are normal. No scleral icterus. Neck supple. No cervical LAD. Cardiovascular: Normal rate, regular rhythm. No edema Pulmonary/chest: Effort normal and breath sounds normal. No wheezing, rales or rhonchi. Abdominal: Soft, nondistended, nontender. Bowel sounds active throughout. There are no masses palpable. No hepatomegaly. Neurological:  Alert and oriented to person place and time. Skin: Skin is warm and dry. No rashes noted. Rectal: Deferred as was recently completed by [facility]   ASSESSMENT AND PLAN;   Alisha Harris is a 27 y.o. female presenting with:  1) Hematochezia: Clinical presentation seems most consistent with benign anal rectal bleeding (i.e. Hemorrhoids > anal fissure) exacerbated by recent change in bowel habits (constipation) and will proceed as below:  -Increase dietary fiber and add fiber supplement (ie, Benefiber, Citrucel) for goal of 25 g of fiber per day with goal for soft, formed stools without straining  to have a BM -Increase daily fluid intake with at least 8 glasses of 8 ounces of water -Avoid straining to have a BM -Avoid prolonged time on the toilet -Start Colace 100 mg PO BID and titrate to effect -Offered colonoscopy to eval for internal hemorrhoids or alternate etiology. Given recent onset of sxs and close association with constipation, she elected to treat for underlying constipation and hold off on colonoscopy at this time. If sxs persist or worsen, she is to call the clinic to schedule for colonoscopy -We briefly discussed further treatment with hemorrhoid band ligation at this appointment, which she would like to hold off on at this time (would plan for colonoscopy prior to this intervention) -Otherwise, no hemodynamic changes due to hematochezia to warrant hospital admission, blood products, urgent endoscopy, etc. -Sent request for recent labs for review. If anemia, will call to schedule for colonoscopy -Return to clinic prn  2) Constipation: Change in bowel habits over last few months. Discussed etiology an ddiagnostic and tx options, to include colonoscopy as above. Will tx as outlined above  I spent a total of 45 minutes of face-to-face time with the patient. Greater than 50% of the time was spent counseling and coordinating care.    Shellia Cleverly, DO, FACG  11/10/2017, 1:52 PM   Richmond Campbell., PA-C

## 2017-11-17 ENCOUNTER — Ambulatory Visit (INDEPENDENT_AMBULATORY_CARE_PROVIDER_SITE_OTHER): Payer: 59 | Admitting: Pulmonary Disease

## 2017-11-17 ENCOUNTER — Encounter: Payer: Self-pay | Admitting: Pulmonary Disease

## 2017-11-17 ENCOUNTER — Other Ambulatory Visit (INDEPENDENT_AMBULATORY_CARE_PROVIDER_SITE_OTHER): Payer: 59

## 2017-11-17 DIAGNOSIS — J4551 Severe persistent asthma with (acute) exacerbation: Secondary | ICD-10-CM

## 2017-11-17 DIAGNOSIS — J4540 Moderate persistent asthma, uncomplicated: Secondary | ICD-10-CM

## 2017-11-17 DIAGNOSIS — Z7952 Long term (current) use of systemic steroids: Secondary | ICD-10-CM | POA: Diagnosis not present

## 2017-11-17 LAB — CBC WITH DIFFERENTIAL/PLATELET
Basophils Absolute: 0.1 10*3/uL (ref 0.0–0.1)
Basophils Relative: 0.8 % (ref 0.0–3.0)
Eosinophils Absolute: 0.6 10*3/uL (ref 0.0–0.7)
Eosinophils Relative: 5.8 % — ABNORMAL HIGH (ref 0.0–5.0)
HCT: 40.2 % (ref 36.0–46.0)
Hemoglobin: 13.9 g/dL (ref 12.0–15.0)
Lymphocytes Relative: 21.5 % (ref 12.0–46.0)
Lymphs Abs: 2.3 10*3/uL (ref 0.7–4.0)
MCHC: 34.7 g/dL (ref 30.0–36.0)
MCV: 93.4 fl (ref 78.0–100.0)
Monocytes Absolute: 0.6 10*3/uL (ref 0.1–1.0)
Monocytes Relative: 5.8 % (ref 3.0–12.0)
Neutro Abs: 7.2 10*3/uL (ref 1.4–7.7)
Neutrophils Relative %: 66.1 % (ref 43.0–77.0)
Platelets: 360 10*3/uL (ref 150.0–400.0)
RBC: 4.3 Mil/uL (ref 3.87–5.11)
RDW: 12.9 % (ref 11.5–15.5)
WBC: 10.9 10*3/uL — ABNORMAL HIGH (ref 4.0–10.5)

## 2017-11-17 MED ORDER — PREDNISONE 10 MG PO TABS: 10.0000 mg | ORAL_TABLET | ORAL | 0 refills | Status: DC

## 2017-11-17 MED ORDER — AMOXICILLIN-POT CLAVULANATE 875-125 MG PO TABS: 1.0000 | ORAL_TABLET | Freq: Two times a day (BID) | ORAL | 0 refills | Status: DC

## 2017-11-17 MED ORDER — BECLOMETHASONE DIPROPIONATE 80 MCG/ACT IN AERS: 2.0000 | INHALATION_SPRAY | Freq: Two times a day (BID) | RESPIRATORY_TRACT | 5 refills | Status: DC

## 2017-11-17 NOTE — Progress Notes (Signed)
Synopsis: Referred in 10/2017 for severe persistent asthma  Subjective:   PATIENT ID: Alisha Harris GENDER: female DOB: Jun 12, 1990, MRN: [id number]   HPI  Chief Complaint  Patient presents with  . CONSULT    increasing SOB over one month - coughs "all day" - non-productive.  Hx asthma but recently had more 'infections' and recent wheezing   [name] is a 27 year old female with history of childhood asthma, allergic rhinitis, anxiety who presents as new consult for asthma management.  For there last two months, she has had worsening symptoms including dyspnea on exertion, wheezing and daily productive cough with thick clear mucous throughout the day and night. Associated with chest pain with cough, chest congestion. Symptoms worsen with activity and talking. She had to stop twice for at least a minute on the way to clinic to recover her breath. She reports fatigue.  Albuterol and nebulizer used at least 4-5 times daily with improvement. Aggravated by seasonal allergies, animal dander, grass, mold/mildew, sickness.  For her allergies, she takes nasal spray and singulair. Taken symbicort x 2 weeks with no subjective improvement. Steroid injections no longer improve her symptoms. Mucinex no longer helps her symptoms. When she leaves her apartment and she believes she feels better.  Denies fevers, chills, unintentional weight loss, hemoptysis. Reports reflux and nausea. Recently found pregnant 7-14 weeks.    PCP visit at [facility] 10/27/2017 (reviewed and summarized): Seen by PCP for gradually worsening asthma symptoms including worsening nonproductive cough, chest pain related to cough and wheezing.  Has tried albuterol inhaler, nebulizer, Singulair and Nasonex with mild relief.  In September she was treated for bronchitis prescribed prednisone 20 mg taper and Z-Pak with partial symptom relief.  Given IM methylprednisolone injection 80 mg for exacerbation.  She was advised to  continue Symbicort and albuterol inhaler, restarted on antihistamine and referred to pulmonary.  Care everywhere EMR was reviewed and since 08/2016 patient has been treated for asthma exacerbation at least 4 times with steroids, bronchodilators and antibiotics  Age of tobacco initiation: 27 years old  Age of tobacco cessation: 27 years old Average packs/day: 1/2 ppd Environmental exposures: None. No recent travel. Lives in apartment.  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  Past Medical History:  Diagnosis Date  . Anxiety   . Asthma      Family History  Problem Relation Age of Onset  . Ovarian cancer Mother   . Diabetes Maternal Grandfather   . Pulmonary fibrosis Maternal Grandfather   . Diabetes Paternal Grandmother   . Diabetes Paternal Grandfather   . Colon cancer Neg Hx   . Esophageal cancer Neg Hx   . Breast cancer Neg Hx      Social History   Socioeconomic History  . Marital status: Single    Spouse name: Not on file  . Number of children: 1  . Years of education: Not on file  . Highest education level: Not on file  Occupational History  . Not on file  Social Needs  . Financial resource strain: Not on file  . Food insecurity:    Worry: Not on file    Inability: Not on file  . Transportation needs:    Medical: Not on file    Non-medical: Not on file  Tobacco Use  . Smoking status: Former Smoker    Packs/day: 0.50    Last attempt  to quit: 2009    Years since quitting: 10.8  . Smokeless tobacco: Never Used  . Tobacco comment: did it here and there. stopped around 16  Substance and Sexual Activity  . Alcohol use: No  . Drug use: No  . Sexual activity: Yes    Birth control/protection: IUD  Lifestyle  . Physical activity:    Days per week: Not on file    Minutes per session: Not on file  . Stress: Not on file    Relationships  . Social connections:    Talks on phone: Not on file    Gets together: Not on file    Attends religious service: Not on file    Active member of club or organization: Not on file    Attends meetings of clubs or organizations: Not on file    Relationship status: Not on file  . Intimate partner violence:    Fear of current or ex partner: Not on file    Emotionally abused: Not on file    Physically abused: Not on file    Forced sexual activity: Not on file  Other Topics Concern  . Not on file  Social History Narrative  . Not on file     No Known Allergies   Outpatient Medications Prior to Visit  Medication Sig Dispense Refill  . albuterol (PROVENTIL HFA;VENTOLIN HFA) 108 (90 Base) MCG/ACT inhaler Inhale 2 puffs into the lungs every 6 (six) hours as needed for wheezing or shortness of breath. 1 Inhaler 1  . amphetamine-dextroamphetamine (ADDERALL) 20 MG tablet Take by mouth.    Marland Kitchen buPROPion (WELLBUTRIN XL) 150 MG 24 hr tablet Take by mouth.    . cetirizine (ZYRTEC) 10 MG tablet Take by mouth.    . mometasone (NASONEX) 50 MCG/ACT nasal spray as needed.    . montelukast (SINGULAIR) 10 MG tablet Take 10 mg by mouth at bedtime.     No facility-administered medications prior to visit.     Review of Systems  Constitutional: Positive for malaise/fatigue. Negative for chills, diaphoresis, fever and weight loss.  HENT: Positive for congestion and sore throat.   Cardiovascular: Positive for chest pain and PND. Negative for palpitations and leg swelling.  Gastrointestinal: Positive for heartburn and nausea. Negative for abdominal pain and vomiting.  Musculoskeletal: Positive for myalgias.  Skin: Negative for rash.  Neurological: Positive for headaches. Negative for dizziness, loss of consciousness and weakness.  Endo/Heme/Allergies: Positive for environmental allergies.  Psychiatric/Behavioral: The patient is nervous/anxious.       Objective:  Physical Exam   Constitutional: She is oriented to person, place, and time. She appears well-developed and well-nourished. No distress.  HENT:  Head: Normocephalic and atraumatic.  Nose: Nose normal.  Mouth/Throat: No oropharyngeal exudate.  Tonsillar enlargement bilaterally  Eyes: Conjunctivae and EOM are normal. No scleral icterus.  Neck: Normal range of motion. No JVD present. No tracheal deviation present.  Cardiovascular: Normal rate, regular rhythm, normal heart sounds and intact distal pulses. Exam reveals no gallop and no friction rub.  No murmur heard. Pulmonary/Chest: Effort normal. No stridor. No respiratory distress. She has wheezes. She exhibits tenderness.  Abdominal: Soft. Bowel sounds are normal. She exhibits no distension. There is no tenderness.  Musculoskeletal: Normal range of motion. She exhibits no edema.  Neurological: She is alert and oriented to person, place, and time. No cranial nerve deficit or sensory deficit. Coordination normal.  Skin: Skin is warm and dry. She is not diaphoretic. No erythema. No pallor.  Vitals  reviewed.    Vitals:   11/17/17 0857  BP: 118/68  Pulse: 98  SpO2: 93%  Weight: 237 lb (107.5 kg)  Height: 5' 5.5" (1.664 m)    CBC    Component Value Date/Time   WBC 5.2 02/06/2015 1054   RBC 4.61 02/06/2015 1054   HGB 14.3 02/06/2015 1054   HCT 43.5 02/06/2015 1054   PLT 312 02/06/2015 1054   MCV 94.4 02/06/2015 1054   MCH 31.0 02/06/2015 1054   MCHC 32.9 02/06/2015 1054   RDW 12.5 02/06/2015 1054     Chest imaging: CXR 01/27/17 - No acute abnormalities. No edema, effusion or infiltrate  PFT: None on file  I have personally reviewed the above labs, images and tests noted above.    Assessment & Plan:   Severe persistent asthma refractory to systemic steroids with acute exacerbation - Plan: CBC w/Diff, IgE, Eosinophil count, Pulmonary function test, CANCELED: Pulmonary function test  Discussion: 26 year old pregnant female with history of  childhood asthma who presents to clinic with severe persistent asthma in active exacerbation.  Will increase to high-dose inhaled corticosteroid and start prednisone taper and antibiotic.  Labs to be drawn to evaluate for candidacy for new immunotherapy agents including CBC with differential, serum eosinophils and IgE.  For your asthma: --STOP Symbicort inhaler --START Qvar 80 mcg 2 puffs twice a day --START prednisone taper as directed --START Augmentin as directed --CONTINUE albuterol every 4 to 6 hours as needed for shortness of breath or wheezing --We have ordered labs to evaluate your asthma --We have ordered breathing tests (pulmonary function test)  If you develop worsening respiratory symptoms, please seek medical evaluation as soon as possible.  Thank you for choosing [facility] for your health needs!   Latrelle Fuston Mechele Collin, MD [facility] 11/17/2017 9:51 AM  Personal pager: [phone] If unanswered, please page [facility] On-call: #[phone]    Current Outpatient Medications:  .  albuterol (PROVENTIL HFA;VENTOLIN HFA) 108 (90 Base) MCG/ACT inhaler, Inhale 2 puffs into the lungs every 6 (six) hours as needed for wheezing or shortness of breath., Disp: 1 Inhaler, Rfl: 1 .  amphetamine-dextroamphetamine (ADDERALL) 20 MG tablet, Take by mouth., Disp: , Rfl:  .  buPROPion (WELLBUTRIN XL) 150 MG 24 hr tablet, Take by mouth., Disp: , Rfl:  .  cetirizine (ZYRTEC) 10 MG tablet, Take by mouth., Disp: , Rfl:  .  mometasone (NASONEX) 50 MCG/ACT nasal spray, as needed., Disp: , Rfl:  .  montelukast (SINGULAIR) 10 MG tablet, Take 10 mg by mouth at bedtime., Disp: , Rfl:  .  amoxicillin-clavulanate (AUGMENTIN) 875-125 MG tablet, Take 1 tablet by mouth 2 (two) times daily., Disp: 14 tablet, Rfl: 0 .  beclomethasone (QVAR) 80 MCG/ACT inhaler, Inhale 2 puffs into the lungs 2 (two) times daily., Disp: 1 Inhaler, Rfl: 5 .  predniSONE (DELTASONE) 10 MG tablet, Take 1 tablet (10  mg total) by mouth See admin instructions. Prednisone taper 60 mg for 3 days 50 mg for 3 days 40 mg for 3 days 30 mg for 3 days 20 mg for 3 days 10 mg for 3 days then STOP, Disp: 20 tablet, Rfl: 0

## 2017-11-17 NOTE — Patient Instructions (Addendum)
For your asthma: --STOP Symbicort inhaler --START Qvar 80 mcg 2 puffs twice a day --START prednisone taper as directed --START Augmentin as directed --CONTINUE albuterol every 4 to 6 hours as needed for shortness of breath or wheezing --We have ordered labs to evaluate your asthma --We have ordered breathing tests (pulmonary function test)  If you develop worsening respiratory symptoms, please seek medical evaluation as soon as possible.

## 2017-11-18 LAB — IGE: IgE (Immunoglobulin E), Serum: 85 kU/L (ref <=114)

## 2017-11-22 ENCOUNTER — Telehealth: Payer: Self-pay | Admitting: Pulmonary Disease

## 2017-11-22 NOTE — Telephone Encounter (Signed)
Contacted by Dr. Darleene Cleaver at [facility] [phone]) regarding [name].  Patient admitted on 11/19/17 for failing outpatient management of asthma exacerbation. Primary team requesting transfer however per [facility], no beds available at our facility. I recommended general steroid management and optimizing bronchodilator therapy for asthma. I deferred decision to primary team to determine if patient still warrants transfer to another facility. Dr. Darleene Cleaver stated patient is clinically stable.

## 2017-11-22 NOTE — Progress Notes (Signed)
Please contact patient.  Lab tests show elevated eosinophil counts. Eosinophils are infection fighting cells that can be associated with allergic disease. I suspect your asthma has an allergic component that may be responsive to some of the newer medications available for asthma treatment. Please be sure to get your pulmonary function test (breathing tests) done.

## 2017-11-26 ENCOUNTER — Telehealth: Payer: Self-pay | Admitting: Pulmonary Disease

## 2017-11-26 NOTE — Telephone Encounter (Signed)
Called and spoke with Patient.  Patient stated that Dr. Everardo All had spoke with her yesterday, 11/1.  She stated that she had been in the hospital at [facility] and then was at the ED at [facility].  She stated that she spoke with Dr. Everardo All who told her that she could have a PFT 11/26/17 and see her in the office afterwards.  Dr. Everardo All is not in the office 11/1 or any next week.  First available appointment for PFT is 11/29/17 at 1100.  Patient scheduled for PFT and 1200 appointment for follow up with Ames Dura, NP.  Patient instructed to arrive early before scheduled appointment.    Will route message to [facility]  as Lorain Childes

## 2017-11-26 NOTE — Telephone Encounter (Signed)
Thank you :)

## 2017-11-29 ENCOUNTER — Ambulatory Visit (INDEPENDENT_AMBULATORY_CARE_PROVIDER_SITE_OTHER): Payer: 59 | Admitting: Adult Health

## 2017-11-29 ENCOUNTER — Ambulatory Visit (INDEPENDENT_AMBULATORY_CARE_PROVIDER_SITE_OTHER): Payer: 59 | Admitting: Pulmonary Disease

## 2017-11-29 DIAGNOSIS — Z7952 Long term (current) use of systemic steroids: Secondary | ICD-10-CM | POA: Diagnosis not present

## 2017-11-29 DIAGNOSIS — J4541 Moderate persistent asthma with (acute) exacerbation: Secondary | ICD-10-CM | POA: Diagnosis not present

## 2017-11-29 DIAGNOSIS — B370 Candidal stomatitis: Secondary | ICD-10-CM | POA: Diagnosis not present

## 2017-11-29 DIAGNOSIS — J4551 Severe persistent asthma with (acute) exacerbation: Secondary | ICD-10-CM | POA: Diagnosis not present

## 2017-11-29 LAB — PULMONARY FUNCTION TEST
DL/VA % pred: 115 %
DL/VA: 5.74 ml/min/mmHg/L
DLCO cor % pred: 112 %
DLCO cor: 29.59 ml/min/mmHg
DLCO unc % pred: 114 %
DLCO unc: 30.04 ml/min/mmHg
FEF 25-75 Post: 3.64 L/sec
FEF 25-75 Pre: 3.1 L/sec
FEF2575-%Change-Post: 17 %
FEF2575-%Pred-Post: 101 %
FEF2575-%Pred-Pre: 86 %
FEV1-%Change-Post: 3 %
FEV1-%Pred-Post: 102 %
FEV1-%Pred-Pre: 98 %
FEV1-Post: 3.43 L
FEV1-Pre: 3.3 L
FEV1FVC-%Change-Post: 4 %
FEV1FVC-%Pred-Pre: 94 %
FEV6-%Change-Post: 0 %
FEV6-%Pred-Post: 103 %
FEV6-%Pred-Pre: 104 %
FEV6-Post: 4.07 L
FEV6-Pre: 4.11 L
FEV6FVC-%Change-Post: 0 %
FEV6FVC-%Pred-Post: 100 %
FEV6FVC-%Pred-Pre: 100 %
FVC-%Change-Post: 0 %
FVC-%Pred-Post: 102 %
FVC-%Pred-Pre: 103 %
FVC-Post: 4.07 L
FVC-Pre: 4.11 L
Post FEV1/FVC ratio: 84 %
Post FEV6/FVC ratio: 100 %
Pre FEV1/FVC ratio: 80 %
Pre FEV6/FVC Ratio: 100 %
RV % pred: 74 %
RV: 1.08 L
TLC % pred: 97 %
TLC: 5.16 L

## 2017-11-29 MED ORDER — NYSTATIN 100000 UNIT/ML MT SUSP: 5.0000 mL | Freq: Four times a day (QID) | OROMUCOSAL | 0 refills | Status: DC

## 2017-11-29 NOTE — Patient Instructions (Addendum)
Continue on QVAR 2 puffs Twice daily  , rinse after use.  Albuterol Inhaler 2 puffs every 4hr as needed.  Continue on Singulair daily.  Continue on Zyrtec 10mg  At bedtime   Nystatin solution .Four times a day  For 1 week for thrush. -discuss with OB is okay to take .  Follow up with OB today as planned .  Follow up with Dr. Everardo All in 2 weeks and As needed  (Or Norell Brisbin NP )  Please contact office for sooner follow up if symptoms do not improve or worsen or seek emergency care

## 2017-11-29 NOTE — Progress Notes (Signed)
@Patient  ID: Alisha Harris, female    DOB: May 18, 1990, 27 y.o.   MRN: [id number]  Chief Complaint  Patient presents with  . Follow-up    Asthma     Referring provider: Gala Lewandowsky  HPI: 27 yo female seen pulmonary consult November 17, 2017 for asthma    11/29/2017 follow-up asthma Returns for a follow-up for asthma.  Patient was seen on November 17, 2017 for refractory asthma.  She has been having recurrent symptoms of cough wheezing and shortness of breath. Patient has been found to be recently pregnant.  She was changed from Symbicort to Qvar last visit.  She was set up for a PFT today that showed normal lung function with FEV1 at 102%, ratio 84, FVC 102%, no significant bronchodilator response.,  Mid flow reversibility.,  DLCO 114%. Patient says she had a asthma exacerbation last week.  And was hospitalized.  She was discharged on on November 1.  He was treated with antibiotics steroids and nebulizers.  He is currently on Augmentin and a prednisone taper.  She is feeling much better.  With decreased wheezing and cough.  She has not had to use her albuterol inhaler since discharge. Patient has started to have vaginal bleeding and clots.  There is concern for miscarriage.  She has an OB appointment later today. Lab work last visit showed elevated eosinophils at 600. IgE 85   Hospital records requested.   No Known Allergies   There is no immunization history on file for this patient.  Past Medical History:  Diagnosis Date  . Anxiety   . Asthma     Tobacco History: Social History   Tobacco Use  Smoking Status Former Smoker  . Packs/day: 0.50  . Last attempt to quit: 2009  . Years since quitting: 10.8  Smokeless Tobacco Never Used  Tobacco Comment   did it here and there. stopped around 16   Counseling given: Not Answered Comment: did it here and there. stopped around 16   Outpatient Medications Prior to Visit  Medication Sig Dispense Refill  .  albuterol (PROVENTIL HFA;VENTOLIN HFA) 108 (90 Base) MCG/ACT inhaler Inhale 2 puffs into the lungs every 6 (six) hours as needed for wheezing or shortness of breath. 1 Inhaler 1  . amphetamine-dextroamphetamine (ADDERALL) 20 MG tablet Take by mouth.    . beclomethasone (QVAR) 80 MCG/ACT inhaler Inhale 2 puffs into the lungs 2 (two) times daily. 1 Inhaler 5  . buPROPion (WELLBUTRIN XL) 150 MG 24 hr tablet Take by mouth.    . cetirizine (ZYRTEC) 10 MG tablet Take by mouth.    . mometasone (NASONEX) 50 MCG/ACT nasal spray as needed.    . montelukast (SINGULAIR) 10 MG tablet Take 10 mg by mouth at bedtime.    . predniSONE (DELTASONE) 10 MG tablet Take 1 tablet (10 mg total) by mouth See admin instructions. Prednisone taper 60 mg for 3 days 50 mg for 3 days 40 mg for 3 days 30 mg for 3 days 20 mg for 3 days 10 mg for 3 days then STOP 20 tablet 0  . amoxicillin-clavulanate (AUGMENTIN) 875-125 MG tablet Take 1 tablet by mouth 2 (two) times daily. (Patient not taking: Reported on 11/29/2017) 14 tablet 0   No facility-administered medications prior to visit.      Review of Systems  Constitutional:   No  weight loss, night sweats,  Fevers, chills, fatigue, or  lassitude.  HEENT:   No headaches,  Difficulty swallowing,  Tooth/dental  problems, or  Sore throat,                No sneezing, itching, ear ache +, nasal congestion, post nasal drip,   CV:  No chest pain,  Orthopnea, PND, swelling in lower extremities, anasarca, dizziness, palpitations, syncope.   GI  No heartburn, indigestion, abdominal pain, nausea, vomiting, diarrhea, change in bowel habits, loss of appetite, bloody stools.   Resp: No shortness of breath with exertion or at rest.  No excess mucus, no productive cough,  No non-productive cough,  No coughing up of blood.  No change in color of mucus.  No wheezing.  No chest wall deformity  Skin: no rash or lesions.  GU: no dysuria, change in color of urine, no urgency or frequency.   No flank pain, no hematuria   MS:  No joint pain or swelling.  No decreased range of motion.  No back pain.    Physical Exam  BP 134/74 (BP Location: Left Arm, Cuff Size: Normal)   Pulse 93   Ht 5' 5.5" (1.664 m)   Wt 241 lb (109.3 kg)   SpO2 99%   BMI 39.49 kg/m   GEN: A/Ox3; pleasant , NAD, well nourished    HEENT:  Grill/AT,  EACs-clear, TMs-wnl, NOSE-clear, THROAT-clear, no lesions, no postnasal drip . Post pharynx white patches.   NECK:  Supple w/ fair ROM; no JVD; normal carotid impulses w/o bruits; no thyromegaly or nodules palpated; no lymphadenopathy.    RESP  Clear  P & A; w/o, wheezes/ rales/ or rhonchi. no accessory muscle use, no dullness to percussion  CARD:  RRR, no m/r/g, no peripheral edema, pulses intact, no cyanosis or clubbing.  GI:   Soft & nt; nml bowel sounds; no organomegaly or masses detected.   Musco: Warm bil, no deformities or joint swelling noted.   Neuro: alert, no focal deficits noted.    Skin: Warm, no lesions or rashes    Lab Results:  CBC    Component Value Date/Time   WBC 10.9 (H) 11/17/2017 1006   RBC 4.30 11/17/2017 1006   HGB 13.9 11/17/2017 1006   HCT 40.2 11/17/2017 1006   PLT 360.0 11/17/2017 1006   MCV 93.4 11/17/2017 1006   MCH 31.0 02/06/2015 1054   MCHC 34.7 11/17/2017 1006   RDW 12.9 11/17/2017 1006   LYMPHSABS 2.3 11/17/2017 1006   MONOABS 0.6 11/17/2017 1006   EOSABS 0.6 11/17/2017 1006   BASOSABS 0.1 11/17/2017 1006    BMET    Component Value Date/Time   NA 141 02/06/2015 1054   K 4.3 02/06/2015 1054   CL 106 02/06/2015 1054   CO2 27 02/06/2015 1054   GLUCOSE 96 02/06/2015 1054   BUN 11 02/06/2015 1054   CREATININE 0.85 02/06/2015 1054   CALCIUM 9.6 02/06/2015 1054   GFRNONAA >60 02/06/2015 1054   GFRAA >60 02/06/2015 1054    BNP No results found for: BNP  ProBNP No results found for: PROBNP  Imaging: No results found.    PFT Results Latest Ref Rng & Units 11/29/2017  FVC-Pre L 4.11    FVC-Predicted Pre % 103  FVC-Post L 4.07  FVC-Predicted Post % 102  Pre FEV1/FVC % % 80  Post FEV1/FCV % % 84  FEV1-Pre L 3.30  FEV1-Predicted Pre % 98  DLCO UNC% % 114  DLCO COR %Predicted % 115  TLC L 5.16  TLC % Predicted % 97  RV % Predicted % 74    No  results found for: NITRICOXIDE      Assessment & Plan:   Moderate persistent asthma Difficult to control severe persistent asthma. Recent exacerbation with hospitalization. Now improved after steroids and antibiotics.  Hospital records requested.  Does have some elevated assented feels may be a candidate for Biologics. We will see her OB later today for her current pregnancy.  Once pregnancy is confirmed.  We will discuss further maintenance treatment options. If she is pregnant we will need to continue aggressive maintenance therapy.  Once delivery and breast-feeding decision will discuss possible biologic therapy as biologic such as Nucala and Harrington Challenger would not be a option during pregnancy  Plan  Patient Instructions  Continue on QVAR 2 puffs Twice daily  , rinse after use.  Albuterol Inhaler 2 puffs every 4hr as needed.  Continue on Singulair daily.  Continue on Zyrtec 10mg  At bedtime   Nystatin solution .Four times a day  For 1 week for thrush. -discuss with OB is okay to take .  Follow up with OB today as planned .  Follow up with Dr. Everardo All in 2 weeks and As needed  (Or Parrett NP )  Please contact office for sooner follow up if symptoms do not improve or worsen or seek emergency care       Oral candidiasis Nystatin oral suspension x 1 week  Check with OB If preg approved .  Inhaler teaching completed.      Rubye Oaks, NP 11/29/2017

## 2017-11-29 NOTE — Progress Notes (Signed)
PFT done today. 

## 2017-11-29 NOTE — Assessment & Plan Note (Signed)
Difficult to control severe persistent asthma. Recent exacerbation with hospitalization. Now improved after steroids and antibiotics.  Hospital records requested.  Does have some elevated assented feels may be a candidate for Biologics. We will see her OB later today for her current pregnancy.  Once pregnancy is confirmed.  We will discuss further maintenance treatment options. If she is pregnant we will need to continue aggressive maintenance therapy.  Once delivery and breast-feeding decision will discuss possible biologic therapy as biologic such as Nucala and Harrington Challenger would not be a option during pregnancy  Plan  Patient Instructions  Continue on QVAR 2 puffs Twice daily  , rinse after use.  Albuterol Inhaler 2 puffs every 4hr as needed.  Continue on Singulair daily.  Continue on Zyrtec 10mg  At bedtime   Nystatin solution .Four times a day  For 1 week for thrush. -discuss with OB is okay to take .  Follow up with OB today as planned .  Follow up with Dr. Everardo All in 2 weeks and As needed  (Or Parrett NP )  Please contact office for sooner follow up if symptoms do not improve or worsen or seek emergency care

## 2017-11-29 NOTE — Assessment & Plan Note (Addendum)
Nystatin oral suspension x 1 week  Check with OB If preg approved .  Inhaler teaching completed.

## 2017-12-13 ENCOUNTER — Encounter: Payer: Self-pay | Admitting: Adult Health

## 2017-12-13 ENCOUNTER — Ambulatory Visit (INDEPENDENT_AMBULATORY_CARE_PROVIDER_SITE_OTHER): Payer: 59 | Admitting: Adult Health

## 2017-12-13 DIAGNOSIS — B370 Candidal stomatitis: Secondary | ICD-10-CM | POA: Diagnosis not present

## 2017-12-13 DIAGNOSIS — J310 Chronic rhinitis: Secondary | ICD-10-CM

## 2017-12-13 DIAGNOSIS — J4540 Moderate persistent asthma, uncomplicated: Secondary | ICD-10-CM

## 2017-12-13 NOTE — Assessment & Plan Note (Signed)
Cont on preventative regimen Plan  Patient Instructions  Stop QVAR  Begin Dulera 2 puffs Twice daily  , rinse after use.  Albuterol Inhaler 2 puffs every 4hr as needed.  Continue on Singulair daily.  Continue on Zyrtec 10mg  At bedtime   Continue on Nasonex 2 puffs daily. Begin Fasenra . -Paperwork process.  Follow up with Dr. Everardo AllEllison in 4 weeks and As needed  (Or Husam Hohn NP )  Please contact office for sooner follow up if symptoms do not improve or worsen or seek emergency care

## 2017-12-13 NOTE — Progress Notes (Signed)
@Patient  ID: Alisha Harris, female    DOB: 1990-04-27, 27 y.o.   MRN: [id number]  Chief Complaint  Patient presents with  . Follow-up    Asthma     Referring provider: Richmond Campbell PA-C  HPI: 27 yo female never smoker seen pulmonary consult November 17, 2017 for severe asthma .  Has lifelong Asthma from childhood. Severe as younger child, better during teenage years and worse beginning early 7s.     TEST/EVENTS :  11/29/17  PFT -normal lung function with FEV1 at 102%, ratio 84, FVC 102%, no significant bronchodilator response.,  Mid flow reversibility.,  DLCO 114%.  10/2017 >IgE 85 , Eosinophils 600   10/2017 Hospital admission with severe asthma attack-tx w/ steroids   12/13/2017 Follow up ; Severe persistent Asthma  Patient returns for 2 week follow up . She was admitted at the end October to hospital for severe asthma exacerbation. She improved with steroids and nebulized bronchodilators.  She is on QVAR Twice daily  .  Previously on Symbicort but this was changed during recent pregnancy.  Started on Zyrtec last office visit.  She remains on Singulair and Nasonex.. Since last office visit, patient informs me she has miscarried early pregnancy. She is following with OB.  Patient says she has been diagnosed with asthma since early childhood.  Did have quite a bit of exacerbations as an early child.  During teenage years seem to level off.  And was only on a as needed albuterol inhaler.  However beginning in her early 41s started to have severe asthma has had frequent exacerbations and frequent steroid use. His last visit she says she is doing well.  She has been off of prednisone for 2 to 3 weeks now.  Says that she is not having a flare of cough or wheezing but does notice that her breathing is not as good.  And feels some intermittent tightness.  She has returned back to work.     No Known Allergies   There is no immunization history on file for this  patient.  Past Medical History:  Diagnosis Date  . Anxiety   . Asthma     Tobacco History: Social History   Tobacco Use  Smoking Status Former Smoker  . Packs/day: 0.50  . Last attempt to quit: 2009  . Years since quitting: 10.8  Smokeless Tobacco Never Used  Tobacco Comment   did it here and there. stopped around 18   Counseling given: Not Answered Comment: did it here and there. stopped around 18   Outpatient Medications Prior to Visit  Medication Sig Dispense Refill  . albuterol (PROVENTIL HFA;VENTOLIN HFA) 108 (90 Base) MCG/ACT inhaler Inhale 2 puffs into the lungs every 6 (six) hours as needed for wheezing or shortness of breath. 1 Inhaler 1  . amphetamine-dextroamphetamine (ADDERALL) 20 MG tablet Take 20 mg by mouth 2 (two) times daily.     . beclomethasone (QVAR) 80 MCG/ACT inhaler Inhale 2 puffs into the lungs 2 (two) times daily. 1 Inhaler 5  . buPROPion (WELLBUTRIN XL) 150 MG 24 hr tablet Take 150 mg by mouth daily.     . cetirizine (ZYRTEC) 10 MG tablet Take 10 mg by mouth daily.     . mometasone (NASONEX) 50 MCG/ACT nasal spray Place 1 spray into the nose daily.     . montelukast (SINGULAIR) 10 MG tablet Take 10 mg by mouth at bedtime.    Marland Kitchen nystatin (MYCOSTATIN) 100000 UNIT/ML suspension Take 5 mLs (  500,000 Units total) by mouth 4 (four) times daily. 60 mL 0  . predniSONE (DELTASONE) 10 MG tablet Take 1 tablet (10 mg total) by mouth See admin instructions. Prednisone taper 60 mg for 3 days 50 mg for 3 days 40 mg for 3 days 30 mg for 3 days 20 mg for 3 days 10 mg for 3 days then STOP (Patient not taking: Reported on 12/13/2017) 20 tablet 0   No facility-administered medications prior to visit.      Review of Systems  Constitutional:   No  weight loss, night sweats,  Fevers, chills, fatigue, or  lassitude.  HEENT:   No headaches,  Difficulty swallowing,  Tooth/dental problems, or  Sore throat,                No sneezing, itching, ear ache,  +nasal  congestion, post nasal drip,   CV:  No chest pain,  Orthopnea, PND, swelling in lower extremities, anasarca, dizziness, palpitations, syncope.   GI  No heartburn, indigestion, abdominal pain, nausea, vomiting, diarrhea, change in bowel habits, loss of appetite, bloody stools.   Resp: .  No chest wall deformity  Skin: no rash or lesions.  GU: no dysuria, change in color of urine, no urgency or frequency.  No flank pain, no hematuria   MS:  No joint pain or swelling.  No decreased range of motion.  No back pain.    Physical Exam  BP 114/72 (BP Location: Left Arm, Cuff Size: Large)   Pulse (!) 102   Ht 5\' 5"  (1.651 m)   Wt 242 lb 9.6 oz (110 kg)   SpO2 97%   BMI 40.37 kg/m   GEN: A/Ox3; pleasant , NAD, obese   HEENT:  [place]/AT,  EACs-clear, TMs-wnl, NOSE-clear drainage, THROAT-clear, no lesions, no postnasal drip or exudate noted.   NECK:  Supple w/ fair ROM; no JVD; normal carotid impulses w/o bruits; no thyromegaly or nodules palpated; no lymphadenopathy.    RESP  Clear  P & A; w/o, wheezes/ rales/ or rhonchi. no accessory muscle use, no dullness to percussion  CARD:  RRR, no m/r/g, no peripheral edema, pulses intact, no cyanosis or clubbing.  GI:   Soft & nt; nml bowel sounds; no organomegaly or masses detected.   Musco: Warm bil, no deformities or joint swelling noted.   Neuro: alert, no focal deficits noted.    Skin: Warm, no lesions or rashes    Lab Results:  CBC    Component Value Date/Time   WBC 10.9 (H) 11/17/2017 1006   RBC 4.30 11/17/2017 1006   HGB 13.9 11/17/2017 1006   HCT 40.2 11/17/2017 1006   PLT 360.0 11/17/2017 1006   MCV 93.4 11/17/2017 1006   MCH 31.0 02/06/2015 1054   MCHC 34.7 11/17/2017 1006   RDW 12.9 11/17/2017 1006   LYMPHSABS 2.3 11/17/2017 1006   MONOABS 0.6 11/17/2017 1006   EOSABS 0.6 11/17/2017 1006   BASOSABS 0.1 11/17/2017 1006    BMET    Component Value Date/Time   NA 141 02/06/2015 1054   K 4.3 02/06/2015 1054   CL  106 02/06/2015 1054   CO2 27 02/06/2015 1054   GLUCOSE 96 02/06/2015 1054   BUN 11 02/06/2015 1054   CREATININE 0.85 02/06/2015 1054   CALCIUM 9.6 02/06/2015 1054   GFRNONAA >60 02/06/2015 1054   GFRAA >60 02/06/2015 1054    BNP No results found for: BNP  ProBNP No results found for: PROBNP  Imaging: No results found.  PFT Results Latest Ref Rng & Units 11/29/2017  FVC-Pre L 4.11  FVC-Predicted Pre % 103  FVC-Post L 4.07  FVC-Predicted Post % 102  Pre FEV1/FVC % % 80  Post FEV1/FCV % % 84  FEV1-Pre L 3.30  FEV1-Predicted Pre % 98  FEV1-Post L 3.43  DLCO UNC% % 114  DLCO COR %Predicted % 115  TLC L 5.16  TLC % Predicted % 97  RV % Predicted % 74    No results found for: NITRICOXIDE      Assessment & Plan:   Moderate persistent asthma Severe persistent asthma with frequent exacerbations requiring steroids.  Patient does appear to have allergic asthma with elevated eosinophils on differential.  Maximize her current asthma regimen and look to Biologics to hopefully decrease her exacerbations and prevent ongoing steroid exposure. Discussed in detail potential treatment options including Nucala, Fasenra, Dupixent, etc. Advised on asthma action plan Control for triggers Plan  Patient Instructions  Stop QVAR  Begin Dulera 2 puffs Twice daily  , rinse after use.  Albuterol Inhaler 2 puffs every 4hr as needed.  Continue on Singulair daily.  Continue on Zyrtec 10mg  At bedtime   Continue on Nasonex 2 puffs daily. Begin Fasenra . -Paperwork process.  Follow up with Dr. Everardo All in 4 weeks and As needed  (Or Chadrick Sprinkle NP )  Please contact office for sooner follow up if symptoms do not improve or worsen or seek emergency care       Oral candidiasis Resolved  Chronic rhinitis Cont on preventative regimen Plan  Patient Instructions  Stop QVAR  Begin Dulera 2 puffs Twice daily  , rinse after use.  Albuterol Inhaler 2 puffs every 4hr as needed.  Continue on  Singulair daily.  Continue on Zyrtec 10mg  At bedtime   Continue on Nasonex 2 puffs daily. Begin Fasenra . -Paperwork process.  Follow up with Dr. Everardo All in 4 weeks and As needed  (Or Latalia Etzler NP )  Please contact office for sooner follow up if symptoms do not improve or worsen or seek emergency care          Rubye Oaks, NP 12/13/2017

## 2017-12-13 NOTE — Assessment & Plan Note (Signed)
Resolved

## 2017-12-13 NOTE — Assessment & Plan Note (Signed)
Severe persistent asthma with frequent exacerbations requiring steroids.  Patient does appear to have allergic asthma with elevated eosinophils on differential.  Maximize her current asthma regimen and look to Biologics to hopefully decrease her exacerbations and prevent ongoing steroid exposure. Discussed in detail potential treatment options including Nucala, Fasenra, Dupixent, etc. Advised on asthma action plan Control for triggers Plan  Patient Instructions  Stop QVAR  Begin Dulera 2 puffs Twice daily  , rinse after use.  Albuterol Inhaler 2 puffs every 4hr as needed.  Continue on Singulair daily.  Continue on Zyrtec 10mg  At bedtime   Continue on Nasonex 2 puffs daily. Begin Fasenra . -Paperwork process.  Follow up with Dr. Everardo AllEllison in 4 weeks and As needed  (Or Parrett NP )  Please contact office for sooner follow up if symptoms do not improve or worsen or seek emergency care

## 2017-12-13 NOTE — Patient Instructions (Signed)
Stop QVAR  Begin Dulera 2 puffs Twice daily  , rinse after use.  Albuterol Inhaler 2 puffs every 4hr as needed.  Continue on Singulair daily.  Continue on Zyrtec 10mg  At bedtime   Continue on Nasonex 2 puffs daily. Begin Fasenra . -Paperwork process.  Follow up with Dr. Everardo AllEllison in 4 weeks and As needed  (Or Parrett NP )  Please contact office for sooner follow up if symptoms do not improve or worsen or seek emergency care

## 2017-12-15 MED ORDER — MOMETASONE FURO-FORMOTEROL FUM 200-5 MCG/ACT IN AERO: 2.0000 | INHALATION_SPRAY | Freq: Two times a day (BID) | RESPIRATORY_TRACT | 0 refills | Status: DC

## 2017-12-15 MED ORDER — MOMETASONE FURO-FORMOTEROL FUM 200-5 MCG/ACT IN AERO: 2.0000 | INHALATION_SPRAY | Freq: Two times a day (BID) | RESPIRATORY_TRACT | 3 refills | Status: DC

## 2017-12-15 NOTE — Addendum Note (Signed)
Addended by: Boone MasterJONES, Shanequia E on: 12/15/2017 10:35 AM   Modules accepted: Orders

## 2017-12-17 ENCOUNTER — Ambulatory Visit: Payer: 59 | Admitting: Pulmonary Disease

## 2018-01-05 ENCOUNTER — Telehealth: Payer: Self-pay | Admitting: Pulmonary Disease

## 2018-01-05 NOTE — Telephone Encounter (Signed)
Called pt, lmom for her to call [employer] for co-pay asst.. I also gave her our # to call me in case she has questions.

## 2018-01-05 NOTE — Telephone Encounter (Signed)
PA completed via [email or web address] Key: [id number]  Outcome  Approvedtoday  CaseId:[id number];Status:Approved;Review Type:Prior Auth;Coverage Start Date:12/06/2017;Coverage End Date:07/04/2018;  Forwarding to [employer]my Scott for follow-up

## 2018-01-05 NOTE — Telephone Encounter (Signed)
Will call pt to ask her to call [employer] [phone] for co- pay asst.. Once I get off the phone with [facility].. I been transferred a/b 3 times now I'm on enteral hold.

## 2018-01-10 NOTE — Progress Notes (Signed)
Synopsis: Referred in 10/2017 for severe persistent asthma  Subjective:   PATIENT ID: Alisha Harris GENDER: female DOB: November 18, 1990, MRN: [id number]   HPI  Chief Complaint  Patient presents with  . Follow-up    doing some better with [facility].     Alisha Harris is a 27 year old female with history of childhood asthma, allergic rhinitis, anxiety who presents as follow-up for severe persistent asthma.  She was last seen in Pulmonary clinic on 12/13/17 for hospital follow-up after recent hospital admission at the end of October for asthma exacerbation. She was treated with steroids and bronchodilators. Of note, patient was pregnant on prior visit and has had a miscarriage in the interim. Her inhaler regimen was adjusted and she was started on Dulera. Qvar was discontinued. She reports that she has been approved for [employer]. She has never had any injections in the past and is nervous. With her current asthma regimen, she continues to have shortness of breath with exertion. Associated with productive cough daily in the morning, fatigue. Denies wheezing, chest congestion. Has not required albuterol and nebulizer since discharge. Symptoms worsened by seasonal allergies, animal dander, grass, mildew and illness. For allergies, she is compliant with montelukast and nasal spray.Denies fevers, chills, sore throat, chest pain, unintentional weight loss, hemoptysis. Reports anxiousness and nausea.   Current asthma medications: Dulera 200-5 2 puffs twice daily Montelukast 10 mg daily PRN albuterol  Prior medications: Symbicort - no improvement per patient  Steroid Hx:  2018- October XX 2019 Jan Feb March April May June July Aug Sept Oct Nov Dec   X       X X XXX    2020 Feb Feb March April May June July Aug Sept Oct Nov Dec                 Age of tobacco initiation: 27 years old  Age of tobacco cessation: 27 years old Average packs/day: 1/2 ppd Environmental exposures: None. No recent  travel. Lives in apartment.  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 I have reviewed prior clinic notes and summarized medication regimen as noted above.  Past Medical History:  Diagnosis Date  . Anxiety   . Asthma      Family History  Problem Relation Age of Onset  . Ovarian cancer Mother   . Diabetes Maternal Grandfather   . Pulmonary fibrosis Maternal Grandfather   . Diabetes Paternal Grandmother   . Diabetes Paternal Grandfather   . Colon cancer Neg Hx   . Esophageal cancer Neg Hx   . Breast cancer Neg Hx      Social History   Socioeconomic History  . Marital status: Single    Spouse name: Not on file  . Number of children: 1  . Years of education: Not on file  . Highest education level: Not on file  Occupational History  . Not on file  Social Needs  . Financial resource strain: Not on file  . Food insecurity:    Worry: Not on file    Inability: Not on file  . Transportation needs:    Medical: Not on file    Non-medical: Not on file  Tobacco Use  . Smoking status: Former Smoker    Packs/day: 0.50    Last attempt to quit: 2009    Years since quitting: 10.9  . Smokeless tobacco: Never Used  . Tobacco  comment: did it here and there. stopped around 16  Substance and Sexual Activity  . Alcohol use: No  . Drug use: No  . Sexual activity: Yes    Birth control/protection: I.U.D.  Lifestyle  . Physical activity:    Days per week: Not on file    Minutes per session: Not on file  . Stress: Not on file  Relationships  . Social connections:    Talks on phone: Not on file    Gets together: Not on file    Attends religious service: Not on file    Active member of club or organization: Not on file    Attends meetings of clubs or organizations: Not on file    Relationship status: Not on file  . Intimate partner violence:    Fear  of current or ex partner: Not on file    Emotionally abused: Not on file    Physically abused: Not on file    Forced sexual activity: Not on file  Other Topics Concern  . Not on file  Social History Narrative  . Not on file     No Known Allergies   Outpatient Medications Prior to Visit  Medication Sig Dispense Refill  . albuterol (PROVENTIL HFA;VENTOLIN HFA) 108 (90 Base) MCG/ACT inhaler Inhale 2 puffs into the lungs every 6 (six) hours as needed for wheezing or shortness of breath. 1 Inhaler 1  . amphetamine-dextroamphetamine (ADDERALL) 20 MG tablet Take 20 mg by mouth 2 (two) times daily.     Marland Kitchen buPROPion (WELLBUTRIN XL) 150 MG 24 hr tablet Take 150 mg by mouth daily.     . cetirizine (ZYRTEC) 10 MG tablet Take 10 mg by mouth daily.     . mometasone (NASONEX) 50 MCG/ACT nasal spray Place 1 spray into the nose daily.     . mometasone-formoterol (DULERA) 200-5 MCG/ACT AERO Inhale 2 puffs into the lungs 2 (two) times daily. 1 Inhaler 3  . mometasone-formoterol (DULERA) 200-5 MCG/ACT AERO Inhale 2 puffs into the lungs 2 (two) times daily. 1 Inhaler 0  . montelukast (SINGULAIR) 10 MG tablet Take 10 mg by mouth at bedtime.    Marland Kitchen nystatin (MYCOSTATIN) 100000 UNIT/ML suspension Take 5 mLs (500,000 Units total) by mouth 4 (four) times daily. 60 mL 0   No facility-administered medications prior to visit.     Review of Systems  Constitutional: Negative for chills, diaphoresis, fever, malaise/fatigue and weight loss.  HENT: Positive for congestion and sinus pain. Negative for sore throat.   Respiratory: Positive for cough, sputum production and shortness of breath. Negative for hemoptysis and wheezing.   Cardiovascular: Negative for chest pain, orthopnea, leg swelling and PND.  Gastrointestinal: Positive for heartburn and nausea. Negative for abdominal pain.  Genitourinary: Negative for frequency.  Musculoskeletal:       Anterior chest wall pain  Skin: Negative for rash.  Neurological:  Negative for dizziness, weakness and headaches.  Endo/Heme/Allergies: Does not bruise/bleed easily.    Objective:  Physical Exam Physical Exam: General: Well-appearing, no acute distress HENT: [place], AT, OP clear, MMM, enlarged tonsils Eyes: EOMI, no scleral icterus Respiratory: Clear to auscultation bilaterally.  No crackles, wheezing or rales Cardiovascular: RRR, -M/R/G, no JVD GI: BS+, soft, nontender Extremities:-Edema,-tenderness Neuro: AAO x4, CNII-XII grossly intact Skin: Intact, no rashes or bruising Psych: Normal mood, normal affect  Vitals:   01/11/18 0837  BP: 120/78  Pulse: 99  SpO2: 97%  Weight: 244 lb 3.2 oz (110.8 kg)   Chest imaging: CXR 01/27/17 -  No acute abnormalities. No edema, effusion or infiltrate  PFT: On steroid inhaler 11/29/2016-mild obstructive defect with FEV1 102%.  No significant bronchodilator effect.  Normal lung volumes and DLCO.  I have personally reviewed the above labs, images and tests noted above.    Assessment & Plan:   Moderate persistent asthma dependent on systemic steroids without complication  Discussion: 27 year old female with history of childhood asthma who presents to clinic with severe persistent asthma. Not in active exacerbation. On high dose steroid regimen. [facility]  For your asthma: --CONTINUE Dulera 200-465mcg 2 puffs twice daily --CONTINUE albuterol every 4 hours as needed for shortness of breath or wheezing --CONTINUE montelukast 10 mg daily --CONTINUE Zyrtec 10mg  At bedtime   --CONTINUE Nasonex 2 puffs daily --Harrington ChallengerFasenra liason [employer](Tammy Scott) will discuss administration and schedule today  If you develop worsening respiratory symptoms, please seek medical evaluation as soon as possible.   Alisha Harris Mechele CollinJane Dorsie Sethi, MD [facility] 01/11/2018 9:11 AM  Personal pager: [phone] If unanswered, please page CCM On-call: #[phone]

## 2018-01-11 ENCOUNTER — Encounter: Payer: Self-pay | Admitting: Pulmonary Disease

## 2018-01-11 ENCOUNTER — Ambulatory Visit (INDEPENDENT_AMBULATORY_CARE_PROVIDER_SITE_OTHER): Payer: 59 | Admitting: Pulmonary Disease

## 2018-01-11 VITALS — BP 120/78 | HR 99 | Wt 244.2 lb

## 2018-01-11 DIAGNOSIS — Z7952 Long term (current) use of systemic steroids: Secondary | ICD-10-CM

## 2018-01-11 DIAGNOSIS — J4540 Moderate persistent asthma, uncomplicated: Secondary | ICD-10-CM | POA: Diagnosis not present

## 2018-01-11 MED ORDER — MONTELUKAST SODIUM 10 MG PO TABS: 10.0000 mg | ORAL_TABLET | Freq: Every day | ORAL | 3 refills | Status: DC

## 2018-01-11 NOTE — Patient Instructions (Addendum)
For your asthma: --CONTINUE Dulera 200-925mcg 2 puffs twice daily --CONTINUE albuterol every 4 hours as needed for shortness of breath or wheezing --CONTINUE montelukast 10 mg daily --CONTINUE Zyrtec 10mg  At bedtime   --CONTINUE Nasonex 2 puffs daily --We will contact you for Fasenra appointment  If you develop worsening respiratory symptoms, please seek medical evaluation as soon as possible.

## 2018-01-14 MED ORDER — EPINEPHRINE 0.3 MG/0.3ML IJ SOAJ: 0.3000 mg | Freq: Once | INTRAMUSCULAR | 11 refills | Status: AC

## 2018-01-14 NOTE — Telephone Encounter (Signed)
Pt called-[facility] medication is here and ready to start. Pt will come in 01/18/18 at 9:45am [facility] and [facility] 2 [facility]. Pt will also have Epipen on hand. Rx for epipen has been [facility] per patient. Nothing more needed at this time.

## 2018-01-14 NOTE — Telephone Encounter (Signed)
First Fasenra Injection here for patient to start; [facility] [place] [phone]  Arrival Date:01/14/18 Lot #: [id number] Exp date: 08/2019

## 2018-01-18 ENCOUNTER — Ambulatory Visit (INDEPENDENT_AMBULATORY_CARE_PROVIDER_SITE_OTHER): Payer: 59

## 2018-01-18 ENCOUNTER — Telehealth: Payer: Self-pay | Admitting: *Deleted

## 2018-01-18 DIAGNOSIS — Z7952 Long term (current) use of systemic steroids: Secondary | ICD-10-CM

## 2018-01-18 DIAGNOSIS — J4540 Moderate persistent asthma, uncomplicated: Secondary | ICD-10-CM

## 2018-01-18 MED ORDER — BENRALIZUMAB 30 MG/ML ~~LOC~~ SOSY
30.0000 mg | PREFILLED_SYRINGE | Freq: Once | SUBCUTANEOUS | Status: AC
  Administered 2018-01-18: 30 mg via SUBCUTANEOUS

## 2018-01-18 NOTE — Telephone Encounter (Signed)
I gave pt her first inj. At 10:25. I monitored her q 15 to 20 mins. For 2 hrs..Pt had no headache or sore throat when she got here or when she left. No other sx or reactions. Pt left at 12:25. Nothing further needed.

## 2018-01-28 ENCOUNTER — Telehealth: Payer: Self-pay | Admitting: Pulmonary Disease

## 2018-01-28 NOTE — Telephone Encounter (Signed)
Called pt to let her know she doesn't have to order her Harrington ChallengerFasenra. I order it. Nothing further needed.

## 2018-02-11 ENCOUNTER — Telehealth: Payer: Self-pay | Admitting: Pulmonary Disease

## 2018-02-11 NOTE — Telephone Encounter (Signed)
Pt called back, she said she did not receive any phone call from [facility] or a letter. ( it maybe in today's mail. Pt. Was upset b/c she took off for her appt., she has to let her work know in advance. I asked Florentina Addison if we could give her a sample, yes so pt will be able to keep her appt.. Nothing further needed.

## 2018-02-11 NOTE — Telephone Encounter (Signed)
I called [facility] and tried to order pt's Fasenra.   [facility] tried to reach her several times to ask for her consent to ship and couldn't. So they sent her a letter.  I called the pt, had to leave a message. I gave her [facility] # to call so she can give her consent.  Will call [facility] first thing Monday morning to see if I can place the order.

## 2018-02-14 ENCOUNTER — Ambulatory Visit (INDEPENDENT_AMBULATORY_CARE_PROVIDER_SITE_OTHER): Payer: 59

## 2018-02-14 DIAGNOSIS — Z7952 Long term (current) use of systemic steroids: Secondary | ICD-10-CM | POA: Diagnosis not present

## 2018-02-14 DIAGNOSIS — J4540 Moderate persistent asthma, uncomplicated: Secondary | ICD-10-CM | POA: Diagnosis not present

## 2018-02-16 MED ORDER — BENRALIZUMAB 30 MG/ML ~~LOC~~ SOSY
30.0000 mg | PREFILLED_SYRINGE | Freq: Once | SUBCUTANEOUS | Status: AC
  Administered 2018-02-16: 30 mg via SUBCUTANEOUS

## 2018-03-10 ENCOUNTER — Telehealth: Payer: Self-pay | Admitting: *Deleted

## 2018-03-10 MED ORDER — CLOTRIMAZOLE 10 MG MT TROC: 10.0000 mg | Freq: Every day | OROMUCOSAL | 0 refills | Status: DC

## 2018-03-10 NOTE — Telephone Encounter (Signed)
Tammy please advise. Thanks. 

## 2018-03-10 NOTE — Telephone Encounter (Signed)
Per TP: okay for Mycelex Troche #35, five times daily for 1 week.  Thanks. Rx sent to pharmacy E-mail sent to patient Will sign off

## 2018-03-10 NOTE — Telephone Encounter (Signed)
1 prefilled syringe Ordered date: 03/10/2018 Shipping date: 03/10/2018

## 2018-03-11 NOTE — Telephone Encounter (Signed)
Arrival Date: 03/11/2018 Lot #: [id number] Exp date: 09/2019

## 2018-03-14 ENCOUNTER — Ambulatory Visit (INDEPENDENT_AMBULATORY_CARE_PROVIDER_SITE_OTHER): Payer: 59

## 2018-03-14 DIAGNOSIS — J4540 Moderate persistent asthma, uncomplicated: Secondary | ICD-10-CM | POA: Diagnosis not present

## 2018-03-15 MED ORDER — BENRALIZUMAB 30 MG/ML ~~LOC~~ SOSY
30.0000 mg | PREFILLED_SYRINGE | Freq: Once | SUBCUTANEOUS | Status: AC
  Administered 2018-03-14: 30 mg via SUBCUTANEOUS

## 2018-03-15 NOTE — Progress Notes (Signed)
Fasenra injection documentation and charges entered by Ashley Caulfield, RMA, based on injection sheet filled out by Tammy Scott during preparation and administration. This documentation process is due to office requirements.   

## 2018-03-18 ENCOUNTER — Other Ambulatory Visit: Payer: Self-pay | Admitting: Adult Health

## 2018-03-29 MED ORDER — BENRALIZUMAB 30 MG/ML ~~LOC~~ SOSY
30.0000 mg | PREFILLED_SYRINGE | Freq: Once | SUBCUTANEOUS | Status: AC
  Administered 2018-02-14: 30 mg via SUBCUTANEOUS

## 2018-03-29 NOTE — Addendum Note (Signed)
Addended by: Dimas Millin B on: 03/29/2018 04:25 PM   Modules accepted: Orders

## 2018-04-18 ENCOUNTER — Ambulatory Visit: Payer: 59 | Admitting: Pulmonary Disease

## 2018-04-27 ENCOUNTER — Other Ambulatory Visit: Payer: Self-pay | Admitting: Adult Health

## 2018-05-11 ENCOUNTER — Telehealth: Payer: Self-pay | Admitting: Pulmonary Disease

## 2018-05-11 NOTE — Telephone Encounter (Signed)
Fasenra ordered for patient's next injection 30mg #1 prefilled syringe Ordered date: 05/11/2018 Expected shipping date form pharmacy: 05/12/2018  

## 2018-05-13 ENCOUNTER — Ambulatory Visit: Payer: 59

## 2018-05-13 NOTE — Telephone Encounter (Signed)
Fasenra Shipment Received:  30mg #1 prefilled syringe Medication arrival Date:05/13/2018 Lot #: [id number] Medication exp date: 07/2019 Received by: TBS 

## 2018-05-19 ENCOUNTER — Ambulatory Visit (INDEPENDENT_AMBULATORY_CARE_PROVIDER_SITE_OTHER): Payer: 59

## 2018-05-19 ENCOUNTER — Other Ambulatory Visit: Payer: Self-pay

## 2018-05-19 DIAGNOSIS — J4540 Moderate persistent asthma, uncomplicated: Secondary | ICD-10-CM

## 2018-05-19 MED ORDER — BENRALIZUMAB 30 MG/ML ~~LOC~~ SOSY
30.0000 mg | PREFILLED_SYRINGE | Freq: Once | SUBCUTANEOUS | Status: AC
  Administered 2018-05-19: 10:00:00 30 mg via SUBCUTANEOUS

## 2018-05-19 NOTE — Progress Notes (Signed)
Have you been hospitalized within the last 10 days?  No Do you have a fever?  No Do you have a cough?  No Do you have a headache or sore throat? No  

## 2018-06-07 ENCOUNTER — Telehealth: Payer: Self-pay | Admitting: Pulmonary Disease

## 2018-06-07 NOTE — Telephone Encounter (Signed)
Received a fax stating the pt's current PA for Harrington Challenger has expired. Renewal PA has been started on Cover My Meds. Key: [id number] PA has been approved 05/08/2018 - 06/07/2019.

## 2018-06-07 NOTE — Telephone Encounter (Signed)
Pt is not due until July 14, 2018.  Spoke with pt. She is wanting to go ahead and make her appointment due to needing to let her employer know about the appointment. Injection has been made for 07/14/2018 at 11:30am. Nothing further was needed at this time.

## 2018-06-09 NOTE — Telephone Encounter (Signed)
Please schedule her for a video visit or office visit tomorrow with APP   She was not suppose to ever stop Dulera . That is her mainstay asthma regimen . Harrington Challenger is for control but not monotherapy .   Will need visit to sort all this out .

## 2018-06-09 NOTE — Telephone Encounter (Signed)
Received the following message from patient:   "My injection switched to 8 weeks and during that time I moved and cleaned the place I was living at really good and cleaned the new place over a period of time, and I noticed myself needing to use my Dulera again and yesterday I felt really thirsty and looked in the back of my throat and I have thrush ): can we call something in to the drug store in [place]?"  Tammy, please advise. Thanks!

## 2018-06-10 ENCOUNTER — Telehealth: Payer: Self-pay | Admitting: *Deleted

## 2018-06-10 NOTE — Telephone Encounter (Signed)
Triage please contact pt to schedule:Parrett, Virgel Bouquet, NP at 06/09/2018 4:38 PM   Status: Signed    Please schedule her for a video visit or office visit tomorrow with APP   She was not suppose to ever stop Dulera . That is her mainstay asthma regimen . Harrington Challenger is for control but not monotherapy .   Will need visit to sort all this out .

## 2018-06-10 NOTE — Telephone Encounter (Signed)
Attempted to call pt but unable to reach. Left message for pt to return call.  When pt returns call, please schedule pt for a mychart video visit per TP to get medications sorted out for pt. Thanks!

## 2018-06-13 NOTE — Telephone Encounter (Signed)
Were we able to reach this patient for visit ?  Please call to follow up on

## 2018-06-14 NOTE — Telephone Encounter (Signed)
I checked pt's appt list and did not see where pt was ever scheduled for a video visit with TP. Called and spoke with pt in regards to this letting her know that TP was needing [place] to schedule her for a video visit to follow up on her symptoms. Stated to her that she was never supposed to have stopped the [facility] as it was her daily regimen to help with her asthma.  While speaking with pt, pt stated that she never stopped taking the [facility] and is still taking it twice daily as prescribed. Pt stated what happened was she developed thrush from the [employer] as she stated she wasn't rinsing her mouth out that well after using the [facility] and caused thrush to happen.  Pt stated through her employer, they have telehealth that they can call if needed so on 5/15, pt did call telealth. Pt was prescribed nystatin 5/15 and has been instructed to take it x1 week. Pt stated the thrush in her mouth is better.

## 2018-06-14 NOTE — Telephone Encounter (Signed)
Okay thanks  She can follow up as needed Please contact office for sooner follow up if symptoms do not improve or worsen or seek emergency care

## 2018-07-05 ENCOUNTER — Telehealth: Payer: Self-pay | Admitting: Pulmonary Disease

## 2018-07-05 NOTE — Telephone Encounter (Signed)
[name] Order: 30mg  #1 prefilled syringe Ordered date: 07/05/2018 Expected date of arrival: 07/08/2018 Ordered by: Desmond Dike, [place]: [facility]

## 2018-07-08 NOTE — Telephone Encounter (Signed)
Fasenra Shipment Received:  30mg  #1 prefilled syringe Medication arrival date: 07/08/2018 Lot #: [id number] Exp date: 07/2019 Received by: Desmond Dike, [place]

## 2018-07-14 ENCOUNTER — Ambulatory Visit (INDEPENDENT_AMBULATORY_CARE_PROVIDER_SITE_OTHER): Payer: 59

## 2018-07-14 ENCOUNTER — Other Ambulatory Visit: Payer: Self-pay

## 2018-07-14 DIAGNOSIS — J4540 Moderate persistent asthma, uncomplicated: Secondary | ICD-10-CM

## 2018-07-14 MED ORDER — BENRALIZUMAB 30 MG/ML ~~LOC~~ SOSY
30.0000 mg | PREFILLED_SYRINGE | Freq: Once | SUBCUTANEOUS | Status: AC
  Administered 2018-07-14: 30 mg via SUBCUTANEOUS

## 2018-07-14 NOTE — Progress Notes (Signed)
Have you been hospitalized within the last 10 days?  No Do you have a fever?  No Do you have a cough?  No Do you have a headache or sore throat? No  

## 2018-08-10 ENCOUNTER — Other Ambulatory Visit: Payer: Self-pay | Admitting: Adult Health

## 2018-08-24 ENCOUNTER — Telehealth: Payer: Self-pay | Admitting: Adult Health

## 2018-08-24 NOTE — Telephone Encounter (Signed)
Alisha Harris is given q8w, calling the pharmacy every month is not necessary. We contact the pharmacy once the pt has an appointment to set up shipment. Pt has a pending injection appointment on 09/08/2018, her medication will be ordered next week prior to her appointment.

## 2018-08-30 ENCOUNTER — Telehealth: Payer: Self-pay | Admitting: Adult Health

## 2018-08-30 NOTE — Telephone Encounter (Signed)
Fasenra Order: 30mg #1 prefilled syringe Ordered date: 08/30/18 Expected date of arrival: 09/02/18 Ordered by: Gabryel Files T,LPN Speciality Pharmacy: [facility] 

## 2018-09-02 NOTE — Telephone Encounter (Signed)
Fasenra Shipment Received:  30mg #1 prefilled syringe Medication arrival date: 09/02/18  Lot #: [id number] Exp date: 10/27/2019 Received by: Ichelle Shaneequa Bahner, CMA  

## 2018-09-06 ENCOUNTER — Ambulatory Visit: Payer: 59

## 2018-09-07 ENCOUNTER — Ambulatory Visit: Payer: 59

## 2018-09-08 ENCOUNTER — Ambulatory Visit (INDEPENDENT_AMBULATORY_CARE_PROVIDER_SITE_OTHER): Payer: 59

## 2018-09-08 ENCOUNTER — Other Ambulatory Visit: Payer: Self-pay

## 2018-09-08 DIAGNOSIS — J4540 Moderate persistent asthma, uncomplicated: Secondary | ICD-10-CM

## 2018-09-08 MED ORDER — BENRALIZUMAB 30 MG/ML ~~LOC~~ SOSY
30.0000 mg | PREFILLED_SYRINGE | Freq: Once | SUBCUTANEOUS | Status: AC
  Administered 2018-09-08: 14:00:00 30 mg via SUBCUTANEOUS

## 2018-09-08 NOTE — Progress Notes (Signed)
Have you been hospitalized within the last 10 days?  No Do you have a fever?  No Do you have a cough?  No Do you have a headache or sore throat? No Do you have your Epi Pen visible and is it within date?  Yes 

## 2018-09-13 ENCOUNTER — Ambulatory Visit: Payer: 59

## 2018-10-24 MED ORDER — MOMETASONE FURO-FORMOTEROL FUM 200-5 MCG/ACT IN AERO: 2.0000 | INHALATION_SPRAY | Freq: Two times a day (BID) | RESPIRATORY_TRACT | 0 refills | Status: DC

## 2018-10-24 NOTE — Telephone Encounter (Signed)
Message routed to [facility] injection pool.  Good morning, I have currently been having the Fascenra injections administered every 8 weeks for quite some time now, I do not have insurance anymore as I am no longer employed at the same company I wanted to see how I can go about continuing my treatment plan under these circumstances? Also, since my last injection I have not been feeling well, it seems like the last injection did not work at all, I have completely used all of my Dulera Inhaler and am short of breathe and have been for weeks and weeks it is to the point where my ribs are hurting and seems like my last injection may not have been effective at all I certainly have held out as long as I can before brining this issue to hand so I would like to see how we can move forward? If someone could please let me know at their earliest convenience. Thank you so much!!

## 2018-10-26 ENCOUNTER — Telehealth: Payer: Self-pay | Admitting: Pulmonary Disease

## 2018-10-26 NOTE — Telephone Encounter (Signed)
Patient dropped off completed [employer] Fasenra prescription savings application. Completed applications faxed to [employer].

## 2018-10-28 NOTE — Telephone Encounter (Signed)
Called [employer] to discuss this matter. They are going to need a letter from the pt's former insurance company stating that she longer has an active policy with them. This letter will need to be faxed to [phone]. Once the letter is faxed, we will need to contact [employer] to let them know that this has been done.  Spoke with the pt. She is very upset that this has happened. I apologized many times and assured her that we would do everything we could to make this right. Pt has an appointment coming up and does not want to miss a dose due to our office error. Advised her that we have samples that we have in office that we will use since this was our mistake. Pt is going to contact her insurance company and get this letter. She will send this letter to [place] once she receives it. Will continue to follow up.

## 2018-10-28 NOTE — Telephone Encounter (Signed)
We received a denial from [employer] for [facility] patient assistance. Insurance cards were faxed to [employer] along with the pt's paperwork. Pt NO LONGER has insurance which is why we were helping her apply for patient assistance. We will need to call [employer] and try to fix this for the pt.

## 2018-11-01 NOTE — Telephone Encounter (Signed)
Called [employer] to check on status of patient assistance. Per Lonn Georgia, [employer], as of today, Patient has not sent in any letter stating she does not have insurance.

## 2018-11-02 NOTE — Telephone Encounter (Signed)
Letter has been faxed to [employer]. They have been notified.

## 2018-11-02 NOTE — Telephone Encounter (Signed)
I have received the letter from the pt that [employer] needs in order to over turn this denial. This will be followed up on later today.

## 2018-11-08 ENCOUNTER — Other Ambulatory Visit: Payer: Self-pay

## 2018-11-08 ENCOUNTER — Ambulatory Visit (INDEPENDENT_AMBULATORY_CARE_PROVIDER_SITE_OTHER): Payer: Self-pay

## 2018-11-08 DIAGNOSIS — J4540 Moderate persistent asthma, uncomplicated: Secondary | ICD-10-CM

## 2018-11-08 MED ORDER — BENRALIZUMAB 30 MG/ML ~~LOC~~ SOSY
30.0000 mg | PREFILLED_SYRINGE | Freq: Once | SUBCUTANEOUS | Status: AC
  Administered 2018-11-08: 30 mg via SUBCUTANEOUS

## 2018-11-08 NOTE — Progress Notes (Signed)
Have you been hospitalized within the last 10 days?  No Do you have a fever?  No Do you have a cough?  No Do you have a headache or sore throat? No  

## 2018-11-10 MED ORDER — BENRALIZUMAB 30 MG/ML ~~LOC~~ SOSY: 30.0000 mg | PREFILLED_SYRINGE | Freq: Once | SUBCUTANEOUS | Status: DC

## 2018-11-10 MED ORDER — BENRALIZUMAB 30 MG/ML ~~LOC~~ SOSY
30.0000 mg | PREFILLED_SYRINGE | Freq: Once | SUBCUTANEOUS | Status: AC
  Administered 2018-11-08: 30 mg via SUBCUTANEOUS

## 2018-11-10 NOTE — Addendum Note (Signed)
Addended by: Elton Sin on: 11/10/2018 11:22 AM   Modules accepted: Orders

## 2018-11-14 ENCOUNTER — Telehealth: Payer: Self-pay | Admitting: *Deleted

## 2018-11-14 NOTE — Telephone Encounter (Signed)
Spoke with patient.  She is to see Dr. Loanne Drilling on 11/15/18

## 2018-11-14 NOTE — Telephone Encounter (Signed)
-----   Message from [place], MD sent at 11/11/2018  1:02 PM EDT ----- Regarding: Patient needs to be seen in office; on Fasenra injections [name], please schedule patient to be seen with me as soon as available.  Tammy, this patient was last seen by me in 12/2017. She has not been in the office after starting Fasenra injections. We have had multiple phone conversations with the patient including her informing the staff that she is out of [facility] but no efforts were made to include me on messaging to consider another appointment.   Can we communicate with the staff that we should pay attention on when patient was last seen by a provider? Patients should be monitored by a provider while biologic agents and cannot be simply receiving injections alone.  Please keep me updated on this.  Rodman Pickle, MD

## 2018-11-15 ENCOUNTER — Other Ambulatory Visit: Payer: Self-pay

## 2018-11-15 ENCOUNTER — Ambulatory Visit (INDEPENDENT_AMBULATORY_CARE_PROVIDER_SITE_OTHER): Payer: Self-pay | Admitting: Pulmonary Disease

## 2018-11-15 ENCOUNTER — Encounter: Payer: Self-pay | Admitting: Pulmonary Disease

## 2018-11-15 VITALS — BP 132/80 | HR 77 | Temp 97.9°F | Ht 65.0 in | Wt 263.4 lb

## 2018-11-15 DIAGNOSIS — J4550 Severe persistent asthma, uncomplicated: Secondary | ICD-10-CM

## 2018-11-15 DIAGNOSIS — J3081 Allergic rhinitis due to animal (cat) (dog) hair and dander: Secondary | ICD-10-CM

## 2018-11-15 MED ORDER — DULERA 200-5 MCG/ACT IN AERO: INHALATION_SPRAY | RESPIRATORY_TRACT | 6 refills | Status: DC

## 2018-11-15 NOTE — Patient Instructions (Signed)
Severe Persistent Asthma  CONTINUE Dulera 200-5 mcg 2 puffs twice a day. Provide patient assistance program information. CONTINUE Albuterol every 4 hours as needed for shortness of breath or wheezing CONTINUE Singulair 10 mg daily CONTINUE Zyertec daily CONTINUE Fasenra injections as scheduled  Asthma Action Plan Increase Dulera to 2 puffs three times a day for worsening shortness of breath, wheezing and cough. If you symptoms do not improve in 24-48 hours, please our office for evaluation and/or prednisone taper.  Follow-up in 6 months

## 2018-11-15 NOTE — Progress Notes (Signed)
Synopsis: Referred in 10/2017 for severe persistent asthma  Subjective:   PATIENT ID: Alisha Harris GENDER: female DOB: 02/28/1990, MRN: [id number]   HPI  Chief Complaint  Patient presents with  . Follow-up    fasenra start 1 year    [name] is a 28 year old female with history of childhood asthma and allergic rhinitis who presents for follow-up for severe persistent asthma.  Since our last visit, she was started on Fasenra on 02/14/18. She feels this medication has made a drastic difference in her quality of life. She has not required any steroids or had any exacerbations since starting this medication. She is compliant with her Ruthe Mannan and rarely uses her rescue inhaler. Denies daily or nocturnal symptoms. Usually fall is the worst time of the year for her and she reports her symptoms are much more mild compared to years prior. This month she has rhinorrhea and chest congestion. She started. having more allergies including congestion. Otherwise, she overall reports feeling well. Reports shortness of breath with moderate exertion. She is working on increasing her activity level.  Symptoms are usually triggered by seasonal allergies, animal dander, grass, mildew and illness. For allergies, she takes with montelukast and zyertec.  Current asthma medications: Dulera 200-5 2 puffs twice daily Montelukast 10 mg daily Zyertec daily PRN albuterol  Prior medications: Symbicort - no improvement per patient  Steroid Hx:  2018- October XX 2019 Jan Feb March April May June July Aug Sept Oct Nov Dec   X       X X XXX    2020 Jan Feb March April May June July Aug Sept Oct Nov Dec                 Age of tobacco initiation: 28 years old  Age of tobacco cessation: 28 years old Average packs/day: 1/2 ppd  Social History: She works in the office for [employer] She is a Theme park manager on weekends  I have personally reviewed patient's past medical/family/social  history/allergies/current medications.  Past Medical History:  Diagnosis Date  . Anxiety   . Asthma      Family History  Problem Relation Age of Onset  . Ovarian cancer Mother   . Diabetes Maternal Grandfather   . Pulmonary fibrosis Maternal Grandfather   . Diabetes Paternal Grandmother   . Diabetes Paternal Grandfather   . Colon cancer Neg Hx   . Esophageal cancer Neg Hx   . Breast cancer Neg Hx      Social History   Socioeconomic History  . Marital status: Single    Spouse name: Not on file  . Number of children: 1  . Years of education: Not on file  . Highest education level: Not on file  Occupational History  . Not on file  Social Needs  . Financial resource strain: Not on file  . Food insecurity    Worry: Not on file    Inability: Not on file  . Transportation needs    Medical: Not on file    Non-medical: Not on file  Tobacco Use  . Smoking status: Former Smoker    Packs/day: 0.05    Years: 2.00    Pack years: 0.10    Start date: 2006    Quit date: 2008    Years since quitting: 12.8  . Smokeless tobacco: Never Used  . Tobacco comment: did it here and there. stopped around   Substance and Sexual Activity  . Alcohol use: No  .  Drug use: No  . Sexual activity: Yes    Birth control/protection: I.U.D.  Lifestyle  . Physical activity    Days per week: Not on file    Minutes per session: Not on file  . Stress: Not on file  Relationships  . Social Musician on phone: Not on file    Gets together: Not on file    Attends religious service: Not on file    Active member of club or organization: Not on file    Attends meetings of clubs or organizations: Not on file    Relationship status: Not on file  . Intimate partner violence    Fear of current or ex partner: Not on file    Emotionally abused: Not on file    Physically abused: Not on file    Forced sexual activity: Not on file  Other Topics Concern  . Not on file  Social History Narrative   . Not on file     No Known Allergies   Outpatient Medications Prior to Visit  Medication Sig Dispense Refill  . albuterol (PROVENTIL HFA;VENTOLIN HFA) 108 (90 Base) MCG/ACT inhaler Inhale 2 puffs into the lungs every 6 (six) hours as needed for wheezing or shortness of breath. 1 Inhaler 1  . amphetamine-dextroamphetamine (ADDERALL) 20 MG tablet Take 20 mg by mouth 2 (two) times daily.     Marland Kitchen buPROPion (WELLBUTRIN XL) 150 MG 24 hr tablet Take 150 mg by mouth daily.     . cetirizine (ZYRTEC) 10 MG tablet Take 10 mg by mouth daily.     . clotrimazole (MYCELEX) 10 MG troche Take 1 tablet (10 mg total) by mouth 5 (five) times daily. 35 tablet 0  . DULERA 200-5 MCG/ACT AERO USE 2 PUFFS TWICE DAILY 13 g 1  . FASENRA 30 MG/ML SOSY INJECT 30 MG UNDER THE SKIN AT WEEKS 0, 4, AND 8 THEN ONCE EVERY 8 WEEKS 1 mL 13  . mometasone (NASONEX) 50 MCG/ACT nasal spray Place 1 spray into the nose daily.     . montelukast (SINGULAIR) 10 MG tablet Take 1 tablet (10 mg total) by mouth at bedtime. 60 tablet 3  . mometasone-formoterol (DULERA) 200-5 MCG/ACT AERO Inhale 2 puffs into the lungs 2 (two) times daily. 8.8 g 0   No facility-administered medications prior to visit.     Review of Systems  Constitutional: Negative for chills, diaphoresis, fever, malaise/fatigue and weight loss.  HENT: Positive for congestion and sinus pain. Negative for sore throat.   Respiratory: Positive for cough, sputum production and shortness of breath. Negative for hemoptysis and wheezing.   Cardiovascular: Negative for chest pain, orthopnea, leg swelling and PND.  Gastrointestinal: Positive for heartburn and nausea. Negative for abdominal pain.  Genitourinary: Negative for frequency.  Musculoskeletal:       Anterior chest wall pain  Skin: Negative for rash.  Neurological: Negative for dizziness, weakness and headaches.  Endo/Heme/Allergies: Does not bruise/bleed easily.    Objective:   Vitals:   11/15/18 1108 11/15/18  1109  BP:  132/80  Pulse:  77  Temp: 97.9 F (36.6 C)   TempSrc: Temporal   SpO2:  99%  Weight: 263 lb 6.4 oz (119.5 kg)   Height: 5\' 5"  (1.651 m)    Physical Exam: General: Well-appearing, no acute distress HENT: [place], AT Eyes: EOMI, no scleral icterus Respiratory: Clear to auscultation bilaterally.  No crackles, wheezing or rales Cardiovascular: RRR, -M/R/G, no JVD GI: BS+, soft, nontender Extremities:-Edema,-tenderness Neuro:  AAO x4, CNII-XII grossly intact Skin: Intact, no rashes or bruising Psych: Normal mood, normal affect  Data Reviewed:  Chest imaging: CXR 01/27/17 - No acute abnormalities. No edema, effusion or infiltrate  PFT:  11/29/2016 Interpretation: On ICS inhaler. Mild obstructive defect with FEV1 102%.  No significant bronchodilator effect.  Normal lung volumes and DLCO.  Labs: CBC    Component Value Date/Time   WBC 10.9 (H) 11/17/2017 1006   RBC 4.30 11/17/2017 1006   HGB 13.9 11/17/2017 1006   HCT 40.2 11/17/2017 1006   PLT 360.0 11/17/2017 1006   MCV 93.4 11/17/2017 1006   MCH 31.0 02/06/2015 1054   MCHC 34.7 11/17/2017 1006   RDW 12.9 11/17/2017 1006   LYMPHSABS 2.3 11/17/2017 1006   MONOABS 0.6 11/17/2017 1006   EOSABS 0.6 11/17/2017 1006   BASOSABS 0.1 11/17/2017 1006   BMET    Component Value Date/Time   NA 141 02/06/2015 1054   K 4.3 02/06/2015 1054   CL 106 02/06/2015 1054   CO2 27 02/06/2015 1054   GLUCOSE 96 02/06/2015 1054   BUN 11 02/06/2015 1054   CREATININE 0.85 02/06/2015 1054   CALCIUM 9.6 02/06/2015 1054   GFRNONAA >60 02/06/2015 1054   GFRAA >60 02/06/2015 1054   Imaging, labs and test noted above have been reviewed independently by me.    Assessment & Plan:   28 year old female with hx of childhood asthma, allergic rhinitis who presents for follow-up of severe persistent asthma. She has mild allergy symptoms but not in active exacerbation. No exacerbations since starting Fasenra in 01/2018. Tolerating biologic well. We  discussed inhaler regimen and biologic, inhaler technique and asthma action plan. We also discussed affordability of her current inhalers and have referred her to patient assistance program.  Severe Persistent Asthma  CONTINUE Dulera 200-5 mcg 2 puffs twice a day. Provide patient assistance program information.  CONTINUE Albuterol every 4 hours as needed for shortness of breath or wheezing CONTINUE Singulair 10 mg daily CONTINUE Zyertec daily CONTINUE Fasenra injections as scheduled  Asthma Action Plan Increase Dulera to 2 puffs three times a day for worsening shortness of breath, wheezing and cough. If you symptoms do not improve in 24-48 hours, please our office for evaluation and/or prednisone taper.  Return in about 6 months (around 05/16/2019).  Greater than 50% of this patient 25-minute office visit was spent face-to-face in counseling with the patient/family. We discussed medical diagnosis and treatment plan as noted.  Mechele CollinJane Treyveon Mochizuki, M.D. [facility] 11/15/2018 11:24 AM

## 2018-11-16 ENCOUNTER — Encounter: Payer: Self-pay | Admitting: *Deleted

## 2018-11-16 NOTE — Telephone Encounter (Addendum)
Contacted [employer] to follow up on this matter. Spoke with [employer]. She states that they did receive the letter that was faxed to them on 11/02/2018. They will also need a letter from [place] stating that the pt does not have prescription drug coverage. This letter has been written and faxed to [phone]. Will follow up on this matter.

## 2018-11-18 NOTE — Telephone Encounter (Signed)
Called [employer] to check the status of this. Spoke with [place]. They did receive the letter that was faxed on 11/16/2018. Pt has been approved for patient assistance >> 11/18/2018 - 10/22/202.  Spoke with pt. Made her aware. Nothing further was needed at this time.

## 2018-11-19 DIAGNOSIS — J4550 Severe persistent asthma, uncomplicated: Secondary | ICD-10-CM | POA: Insufficient documentation

## 2018-11-19 DIAGNOSIS — J3081 Allergic rhinitis due to animal (cat) (dog) hair and dander: Secondary | ICD-10-CM | POA: Insufficient documentation

## 2018-12-05 ENCOUNTER — Encounter: Payer: Self-pay | Admitting: Pulmonary Disease

## 2018-12-05 ENCOUNTER — Telehealth: Payer: Self-pay | Admitting: Pulmonary Disease

## 2018-12-05 ENCOUNTER — Ambulatory Visit (INDEPENDENT_AMBULATORY_CARE_PROVIDER_SITE_OTHER): Payer: Self-pay | Admitting: Pulmonary Disease

## 2018-12-05 ENCOUNTER — Other Ambulatory Visit: Payer: Self-pay

## 2018-12-05 DIAGNOSIS — R6883 Chills (without fever): Secondary | ICD-10-CM | POA: Insufficient documentation

## 2018-12-05 DIAGNOSIS — J4550 Severe persistent asthma, uncomplicated: Secondary | ICD-10-CM

## 2018-12-05 DIAGNOSIS — Z79899 Other long term (current) drug therapy: Secondary | ICD-10-CM | POA: Insufficient documentation

## 2018-12-05 DIAGNOSIS — J310 Chronic rhinitis: Secondary | ICD-10-CM

## 2018-12-05 MED ORDER — AZITHROMYCIN 250 MG PO TABS: ORAL_TABLET | ORAL | 0 refills | Status: DC

## 2018-12-05 MED ORDER — PREDNISONE 10 MG PO TABS: ORAL_TABLET | ORAL | 0 refills | Status: DC

## 2018-12-05 NOTE — Telephone Encounter (Signed)
Reviewed patient's chart. Patient stated that she has returned the patient assistance forms to our office. I do not see any documentation that the paperwork has been received. Advised patient that I would check with [employer] to see if they have received the patient assistance forms.   Patient stated that she has been out of [facility] for at least a week she is having SOB. Patient was scheduled for a televisit today with Aaron Edelman.

## 2018-12-05 NOTE — Assessment & Plan Note (Signed)
Patient with chills, fatigue, increased shortness of breath in the setting of a global pandemic Patient is unable to check her temperature because she does not own a thermometer  Plan: Recommended the patient that she obtain outpatient Covid testing

## 2018-12-05 NOTE — Assessment & Plan Note (Signed)
Likely flare of asthma due to exposure while cleaning out grandparents basement in the setting of being without a maintenance inhaler for the last week  Plan: Azithromycin today Prednisone taper today Outpatient Covid testing Continue [name] We will need to coordinate appointment with clinical pharmacy team this week to check on status of patient assistance application for [facility] as well as to help patient with medication access as she currently has no insurance

## 2018-12-05 NOTE — Telephone Encounter (Signed)
12/05/2018 1257  Patient reporting today during telephone visit that she is returned to her patient assistance application to our office.  She reports that she did this weeks ago.  She left it with a front desk staff member.  She has not been updated on the status of this application. She was applying for PAP through [employer].   I do not see any documented notes regarding the status of this application.  Will route to the front desk team lead Patrice to see if she is seeing this paperwork.  We will also route to Lauren as FYI as she has been working with the patient as this was a Dr. Loanne Drilling patient.  We will route to Dr. Loanne Drilling and the clinical pharmacy team as FYI as well.  I will work to get the patient established with the clinical pharmacy team to help with getting patient access to a maintenance inhaler.  Wyn Quaker FNP

## 2018-12-05 NOTE — Assessment & Plan Note (Signed)
Patient currently does not have any insurance Patient reports that she is applied to the patient assistance program with [employer] Patient is returned this paperwork to our office  Plan: Patient needs upcoming telephonic visit with clinical pharmacy team to check on status of patient assistance paperwork as well as to help patient with medication access to a maintenance inhaler

## 2018-12-05 NOTE — Assessment & Plan Note (Signed)
Plan: Continue Singulair Continue Zyrtec Continue Nasonex Continue [name]

## 2018-12-05 NOTE — Telephone Encounter (Signed)
I will check with the front desk staff - and then follow up with both pharmacy and Lauren on Wednesday when she returns. -pr

## 2018-12-05 NOTE — Patient Instructions (Addendum)
You were seen today by Lauraine Rinne, NP  for:   1. Severe persistent asthma without complication  - azithromycin (ZITHROMAX) 250 MG tablet; 500mg  (two tablets) today, then 250mg  (1 tablet) for the next 4 days  Dispense: 6 tablet; Refill: 0 - predniSONE (DELTASONE) 10 MG tablet; 4 tabs for 2 days, then 3 tabs for 2 days, 2 tabs for 2 days, then 1 tab for 2 days, then stop  Dispense: 20 tablet; Refill: 0  Patient needs an appointment with the clinical pharmacy team to further review status of [employer] application for patient assistance as well as inhaler access.  We will get patient scheduled for a televisit with the clinical pharmacy team on 12/09/2018 to further evaluate and assist the patient.  Continue Fasenra injections  2. Chronic rhinitis  Continue Zyrtec Continue Nasonex Continue Singulair  3. Chills  - Novel Coronavirus, NAA ([facility]); Future  COVID Testing Site Locations (For sick patients only, pre-procedure is done differently)  . [facility] [address] [place],  [address] . [place]  (on [employer])  o Nature conservation officer  . [place] campus, [facility] and [facility] will be open from 10 a.m. - 3 p.m.     We recommend today:  Orders Placed This Encounter  Procedures  . Novel Coronavirus, NAA ([facility])    Standing Status:   Future    Standing Expiration Date:   12/05/2019    Order Specific Question:   Is this test for diagnosis or screening    Answer:   Diagnosis of ill patient    Order Specific Question:   Symptomatic for COVID-19 as defined by CDC    Answer:   Yes    Order Specific Question:   Date of Symptom Onset    Answer:   12/03/2018    Order Specific Question:   Hospitalized for COVID-19    Answer:   No    Order Specific Question:   Admitted to ICU for COVID-19    Answer:   No    Order Specific Question:   Previously tested for  COVID-19    Answer:   No    Order Specific Question:   Resident in a congregate (group) care setting    Answer:   No    Order Specific Question:   Is the patient student?    Answer:   No    Order Specific Question:   Employed in healthcare setting    Answer:   No    Order Specific Question:   Pregnant    Answer:   No   Orders Placed This Encounter  Procedures  . Novel Coronavirus, NAA ([facility])   Meds ordered this encounter  Medications  . azithromycin (ZITHROMAX) 250 MG tablet    Sig: 500mg  (two tablets) today, then 250mg  (1 tablet) for the next 4 days    Dispense:  6 tablet    Refill:  0  . predniSONE (DELTASONE) 10 MG tablet    Sig: 4 tabs for 2 days, then 3 tabs for 2 days, 2 tabs for 2 days, then 1 tab for 2 days, then stop    Dispense:  20 tablet    Refill:  0    Follow Up:    Return in about 2 weeks (around 12/19/2018), or if symptoms worsen or fail to improve, for  Follow up with Dr. Everardo All.  Schedule appointment with clinical pharmacy team on 12/09/2018 for a telephonic visit to discuss inhaler access as well as to check the status of the patient's application with [employer] for patient assistance  Please do your part to reduce the spread of COVID-19:      Reduce your risk of any infection  and COVID19 by using the similar precautions used for avoiding the common cold or flu:  Marland Kitchen Wash your hands often with soap and warm water for at least 20 seconds.  If soap and water are not readily available, use an alcohol-based hand sanitizer with at least 60% alcohol.  . If coughing or sneezing, cover your mouth and nose by coughing or sneezing into the elbow areas of your shirt or coat, into a tissue or into your sleeve (not your hands). Drinda Butts A MASK when in public  . Avoid shaking hands with others and consider head nods or verbal greetings only. . Avoid touching your eyes, nose, or mouth with unwashed hands.  . Avoid close contact with people who are sick. . Avoid places or  events with large numbers of people in one location, like concerts or sporting events. . If you have some symptoms but not all symptoms, continue to monitor at home and seek medical attention if your symptoms worsen. . If you are having a medical emergency, call 911.   ADDITIONAL HEALTHCARE OPTIONS FOR PATIENTS  [facility] / e-Visit: [email or web address]         [facility]: [phone]  [facility]: [phone]                   [facility]: [phone]     It is flu season:   >>> Best ways to protect herself from the flu: Receive the yearly flu vaccine, practice good hand hygiene washing with soap and also using hand sanitizer when available, eat a nutritious meals, get adequate rest, hydrate appropriately   Please contact the office if your symptoms worsen or you have concerns that you are not improving.   Thank you for choosing [facility] for your healthcare, and for allowing [place] to partner with you on your healthcare journey. I am thankful to be able to provide care to you today.   Elisha Headland FNP-C

## 2018-12-05 NOTE — Progress Notes (Signed)
Virtual Visit via Telephone Note  I connected with Alisha Harris on 12/05/18 at 11:30 AM EST by telephone and verified that I am speaking with the correct person using two identifiers.  Location: Patient: Home Provider: Office [facility] - [address] [place], [address], [place], [place] [address]   I discussed the limitations, risks, security and privacy concerns of performing an evaluation and management service by telephone and the availability of in person appointments. I also discussed with the patient that there may be a patient responsible charge related to this service. The patient expressed understanding and agreed to proceed.  Patient consented to consult via telephone: Yes People present and their role in pt care: Pt     History of Present Illness:  28 year old female followed in our office for asthma  Past medical history: Smoking history: Former smoker, quit 2008 Maintenance: Jetty Peeks, Fasenra  Patient of Dr. Everardo All  Chief complaint: Patient unable to afford [facility], contacted our office prompting televisit  28 year old female former smoker completing a telephonic visit with our office today due to acute worsened symptoms of congestion and increased shortness of breath ever since helping family members cleaning out her grandparents basement.  She reports that her breathing has worsened since then.  Patient is also been without her Dulera inhaler for the last week due to being unable to afford this.  She reports that she has completed patient assistance paperwork and drop this off at the front desk of our office but she has not heard back.  Patient has acute symptoms of increased congestion, shortness of breath, fatigue as well as chills.  Patient is unsure if she has had a temperature or a fever because she is unable to check her temperature at home.  Patient reports multiple times "I do not believe I have Covid".   Patient has been maintained on her rescue  inhaler as well as her nebulized meds.  Patient reports is the first time ever since starting Fasenra injections that she is had difficulty with her breathing.  This is also likely directly related to the fact that she is without a maintenance inhaler.  Observations/Objective:  Calm tone over the phone  11/17/2017-CBC with differential-eosinophils relative 5.8, eosinophils absolute 0.6  11/17/2017-IgE-85  11/29/2017-pulmonary function test-FVC 4.11 (103% predicted), postbronchodilator ratio 84, postbronchodilator FEV1 3.43 (102% predicted), no bronchodilator response, DLCO 30.04 (114% predicted)  Assessment and Plan:  Chills Patient with chills, fatigue, increased shortness of breath in the setting of a global pandemic Patient is unable to check her temperature because she does not own a thermometer  Plan: Recommended the patient that she obtain outpatient Covid testing  Severe persistent asthma without complication Likely flare of asthma due to exposure while cleaning out grandparents basement in the setting of being without a maintenance inhaler for the last week  Plan: Azithromycin today Prednisone taper today Outpatient Covid testing Continue Harrington Challenger We will need to coordinate appointment with clinical pharmacy team this week to check on status of patient assistance application for [facility] as well as to help patient with medication access as she currently has no insurance  Chronic rhinitis Plan: Continue Singulair Continue Zyrtec Continue Nasonex Continue Fasenra  Medication management Patient currently does not have any insurance Patient reports that she is applied to the patient assistance program with merck Patient is returned this paperwork to our office  Plan: Patient needs upcoming telephonic visit with clinical pharmacy team to check on status of patient assistance paperwork as well as  to help patient with medication access to a maintenance inhaler   Follow Up  Instructions:  Return in about 2 weeks (around 12/19/2018), or if symptoms worsen or fail to improve, for Follow up with Dr. Loanne Drilling.   I discussed the assessment and treatment plan with the patient. The patient was provided an opportunity to ask questions and all were answered. The patient agreed with the plan and demonstrated an understanding of the instructions.   The patient was advised to call back or seek an in-person evaluation if the symptoms worsen or if the condition fails to improve as anticipated.  I provided 28 minutes of non-face-to-face time during this encounter.   Lauraine Rinne, NP

## 2018-12-06 NOTE — Telephone Encounter (Signed)
I have dropped off the signed forms this morning with the front desk.

## 2018-12-06 NOTE — Telephone Encounter (Signed)
Patient assistance paperwork placed in the mail. [employer] patient assistance program[address] [address]  We will route to Dr. Loanne Drilling and Ander Purpura as Juluis Rainier.  Will route to [employer] with clinical pharmacy team to cancel clinical pharmacy team follow-up as patient assistance paperwork was found and patient will await the response from [employer].  Cherina / Lauren,   Can one of you contact the patient let them know that we have located the paperwork, Dr. Loanne Drilling has signed it, and has been placed in the mail today  Alisha Quaker FNP

## 2018-12-12 NOTE — Telephone Encounter (Signed)
Patient called.

## 2018-12-26 ENCOUNTER — Telehealth: Payer: Self-pay

## 2018-12-26 NOTE — Telephone Encounter (Signed)
Fasenra Order: 30mg #1 prefilled syringe Ordered date: 12/26/18  Expected date of arrival: 12/29/2018 Ordered by: Sevon Rotert L Coron Rossano, CMA  Specialty Pharmacy: [facility] 

## 2018-12-28 NOTE — Telephone Encounter (Signed)
Fasenra Shipment Received:  30mg #1 prefilled syringe Medication arrival date: 12/28/2018 Lot #: [id number] Exp date: 12/2019 Received by: Jassmine Vandruff, CMA   

## 2019-01-04 ENCOUNTER — Ambulatory Visit (INDEPENDENT_AMBULATORY_CARE_PROVIDER_SITE_OTHER): Payer: Medicaid Other

## 2019-01-04 ENCOUNTER — Other Ambulatory Visit: Payer: Self-pay

## 2019-01-04 DIAGNOSIS — J4550 Severe persistent asthma, uncomplicated: Secondary | ICD-10-CM

## 2019-01-04 MED ORDER — BENRALIZUMAB 30 MG/ML ~~LOC~~ SOSY
30.0000 mg | PREFILLED_SYRINGE | Freq: Once | SUBCUTANEOUS | Status: AC
  Administered 2019-01-04: 14:00:00 30 mg via SUBCUTANEOUS

## 2019-01-04 NOTE — Progress Notes (Signed)
Have you been hospitalized within the last 10 days?  No Do you have a fever?  No Do you have a cough?  No Do you have a headache or sore throat? No  

## 2019-02-20 ENCOUNTER — Telehealth: Payer: Self-pay | Admitting: Pulmonary Disease

## 2019-02-20 NOTE — Telephone Encounter (Signed)
Fasenra Order: 30mg #1 prefilled syringe Ordered date: 02/20/2019 Expected date of arrival: 3-5 business days Ordered by: Carlon Davidson, CMA  Specialty Pharmacy: [facility]  

## 2019-02-21 NOTE — Telephone Encounter (Signed)
Fasenra Shipment Received:  30mg #1 prefilled syringe Medication arrival date: 02/21/2019 Lot #: [id number] Exp date: 03/2020 Received by: Chau Sawin, CMA   

## 2019-03-01 ENCOUNTER — Ambulatory Visit: Payer: Medicaid Other

## 2019-03-03 ENCOUNTER — Other Ambulatory Visit: Payer: Self-pay

## 2019-03-03 ENCOUNTER — Ambulatory Visit (INDEPENDENT_AMBULATORY_CARE_PROVIDER_SITE_OTHER): Payer: Medicaid Other

## 2019-03-03 DIAGNOSIS — J4550 Severe persistent asthma, uncomplicated: Secondary | ICD-10-CM | POA: Diagnosis not present

## 2019-03-03 MED ORDER — BENRALIZUMAB 30 MG/ML ~~LOC~~ SOSY
30.0000 mg | PREFILLED_SYRINGE | Freq: Once | SUBCUTANEOUS | Status: AC
  Administered 2019-03-03: 30 mg via SUBCUTANEOUS

## 2019-03-03 NOTE — Progress Notes (Signed)
Have you been hospitalized within the last 10 days?  No Do you have a fever?  No Do you have a cough?  No Do you have a headache or sore throat? No  

## 2019-04-11 ENCOUNTER — Telehealth: Payer: Self-pay | Admitting: Pulmonary Disease

## 2019-04-11 NOTE — Telephone Encounter (Signed)
Harrington Challenger Order: 30mg  #1 prefilled syringe Ordered date: 04/11/2019 Expected date of arrival: 04/13/2019 Ordered by: 04/15/2019, CMA  Specialty Pharmacy: [facility]

## 2019-04-13 NOTE — Telephone Encounter (Signed)
Fasenra Shipment Received:  30mg  #1 prefilled syringe Medication arrival date: 04/13/19  Lot #: [id number] Exp date: 04/2020 Received by: 05/2020, CMA

## 2019-04-14 ENCOUNTER — Ambulatory Visit (INDEPENDENT_AMBULATORY_CARE_PROVIDER_SITE_OTHER): Payer: Medicaid Other

## 2019-04-14 ENCOUNTER — Other Ambulatory Visit: Payer: Self-pay

## 2019-04-14 DIAGNOSIS — J4550 Severe persistent asthma, uncomplicated: Secondary | ICD-10-CM | POA: Diagnosis not present

## 2019-04-14 MED ORDER — BENRALIZUMAB 30 MG/ML ~~LOC~~ SOSY
30.0000 mg | PREFILLED_SYRINGE | Freq: Once | SUBCUTANEOUS | Status: AC
  Administered 2019-04-14: 15:00:00 30 mg via SUBCUTANEOUS

## 2019-04-14 NOTE — Progress Notes (Signed)
Have you been hospitalized within the last 10 days?  No Do you have a fever?  No Do you have a cough?  No Do you have a headache or sore throat? No  

## 2019-04-21 ENCOUNTER — Telehealth: Payer: Self-pay | Admitting: Pulmonary Disease

## 2019-04-21 ENCOUNTER — Ambulatory Visit: Payer: Medicaid Other

## 2019-04-21 NOTE — Telephone Encounter (Signed)
atc pt, no answer and no option for VM.  wcb.

## 2019-04-24 NOTE — Telephone Encounter (Signed)
ATC pt, there was no answer and no option to leave a message. Will try back. 

## 2019-04-25 ENCOUNTER — Telehealth: Payer: Self-pay | Admitting: Pulmonary Disease

## 2019-04-25 MED ORDER — DULERA 200-5 MCG/ACT IN AERO: INHALATION_SPRAY | RESPIRATORY_TRACT | 1 refills | Status: DC

## 2019-04-25 NOTE — Telephone Encounter (Signed)
Pt wasn't able to be seen until 05/29/19 w/pharmacy and Tammy Parrett due to being on probation at new job.  Pt is needing Dulera refilled, completely out of medication.  [facility] in [place].  Please advise.  May leave detailed message on vm at [phone]

## 2019-04-25 NOTE — Telephone Encounter (Signed)
Lest a detailed message advising her that the Elwin Sleight was sent to her pharmacy, [facility] in [place]. Nothing further is needed.

## 2019-04-25 NOTE — Telephone Encounter (Signed)
Instructions from last OV with Arlys John 12/05/18     Follow Up:    Return in about 2 weeks (around 12/19/2018), or if symptoms worsen or fail to improve, for Follow up with Dr. Everardo All.  Schedule appointment with clinical pharmacy team on 12/09/2018 for a telephonic visit to discuss inhaler access as well as to check the status of the patient's application with [employer] for patient assistance  Please do your part to reduce the spread of COVID-19:     It seems like pt was to schedule visit with pharmacy team per Arlys John but that appt was not scheduled and it also looks like pt did not schedule a f/u with Dr. Everardo All per last OV. Pt needs to have appt scheduled with pharmacy team for med management per Arlys John and also needs to have appt scheduled for f/u with Dr. Everardo All.  Attempted to call pt but unable to reach. Left message for pt to return call.

## 2019-04-25 NOTE — Telephone Encounter (Signed)
ATC pt, no answer. Left message for pt to call back.  Will close encounter due to multiple attempts to reach pt, per triage protocal.   

## 2019-04-28 ENCOUNTER — Ambulatory Visit: Payer: Medicaid Other

## 2019-05-09 ENCOUNTER — Telehealth: Payer: Self-pay | Admitting: Pharmacist

## 2019-05-09 NOTE — Telephone Encounter (Signed)
Pharmacist will not be in the office on 5/3.  Called patient to notify we will have to cancel appointment with pharmacy on 5/3 but she should still arrive for her appointment with Tammy Parrett.  Advised she was referred to [place] for medication cost issues and she can call the office to reschedule an appointment with pharmacy if needed.  It appears she is on [facility] and was having issues with patient assistance.  [employer] no longer offers patient assistance for [employer].  Patient now has Medicaid and Elwin Sleight is preferred on formulary.  Verlin Fester, PharmD, [place], CPP Clinical Specialty Pharmacist [phone]  05/09/2019 2:17 PM

## 2019-05-10 ENCOUNTER — Telehealth: Payer: Self-pay | Admitting: Pulmonary Disease

## 2019-05-10 NOTE — Telephone Encounter (Signed)
Agree, patient would be an appropriate candidate for home administration if she can demonstrate good administration technique.  Please ensure patient has follow-up with Pulmonary provider every 3-6 months while on biologic agent.  Mechele Collin, M.D. [facility] Pulmonary/Critical Care Medicine 05/10/2019 9:45 AM

## 2019-05-10 NOTE — Telephone Encounter (Signed)
Pt is currently receiving Fasenra 30mg q8w at our office. We are starting to transition our patients to self administer at home. Please advise if you believe pt would be a good candidate for this. Thanks.  

## 2019-05-20 IMAGING — CR DG CHEST 2V
2 series · 2 of 2 positions shown · non-contrast
Comparison: 02/06/2015

CLINICAL DATA: Persistent cough, fever

EXAM:
CHEST  2 VIEW

[w chest pa]
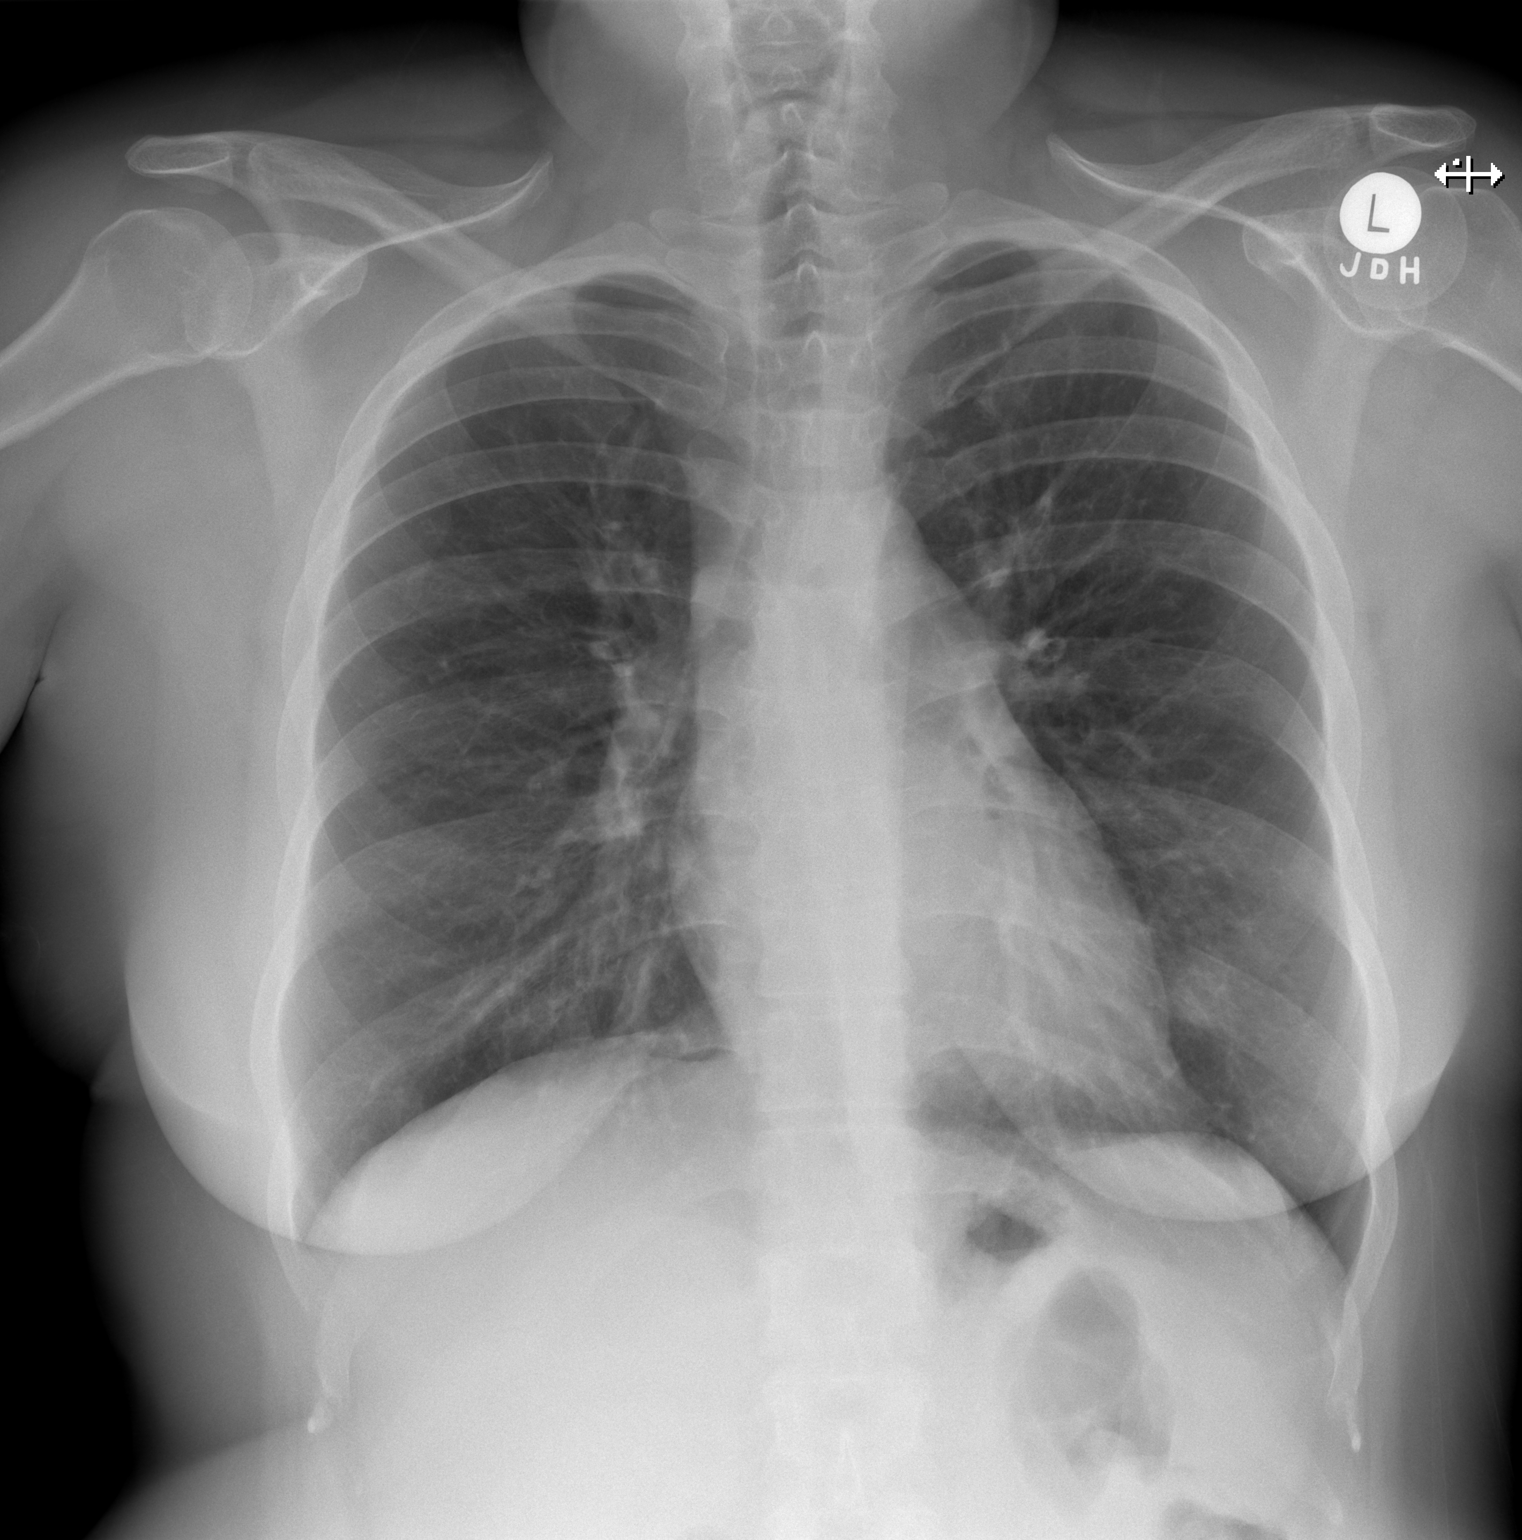

[w chest lat]
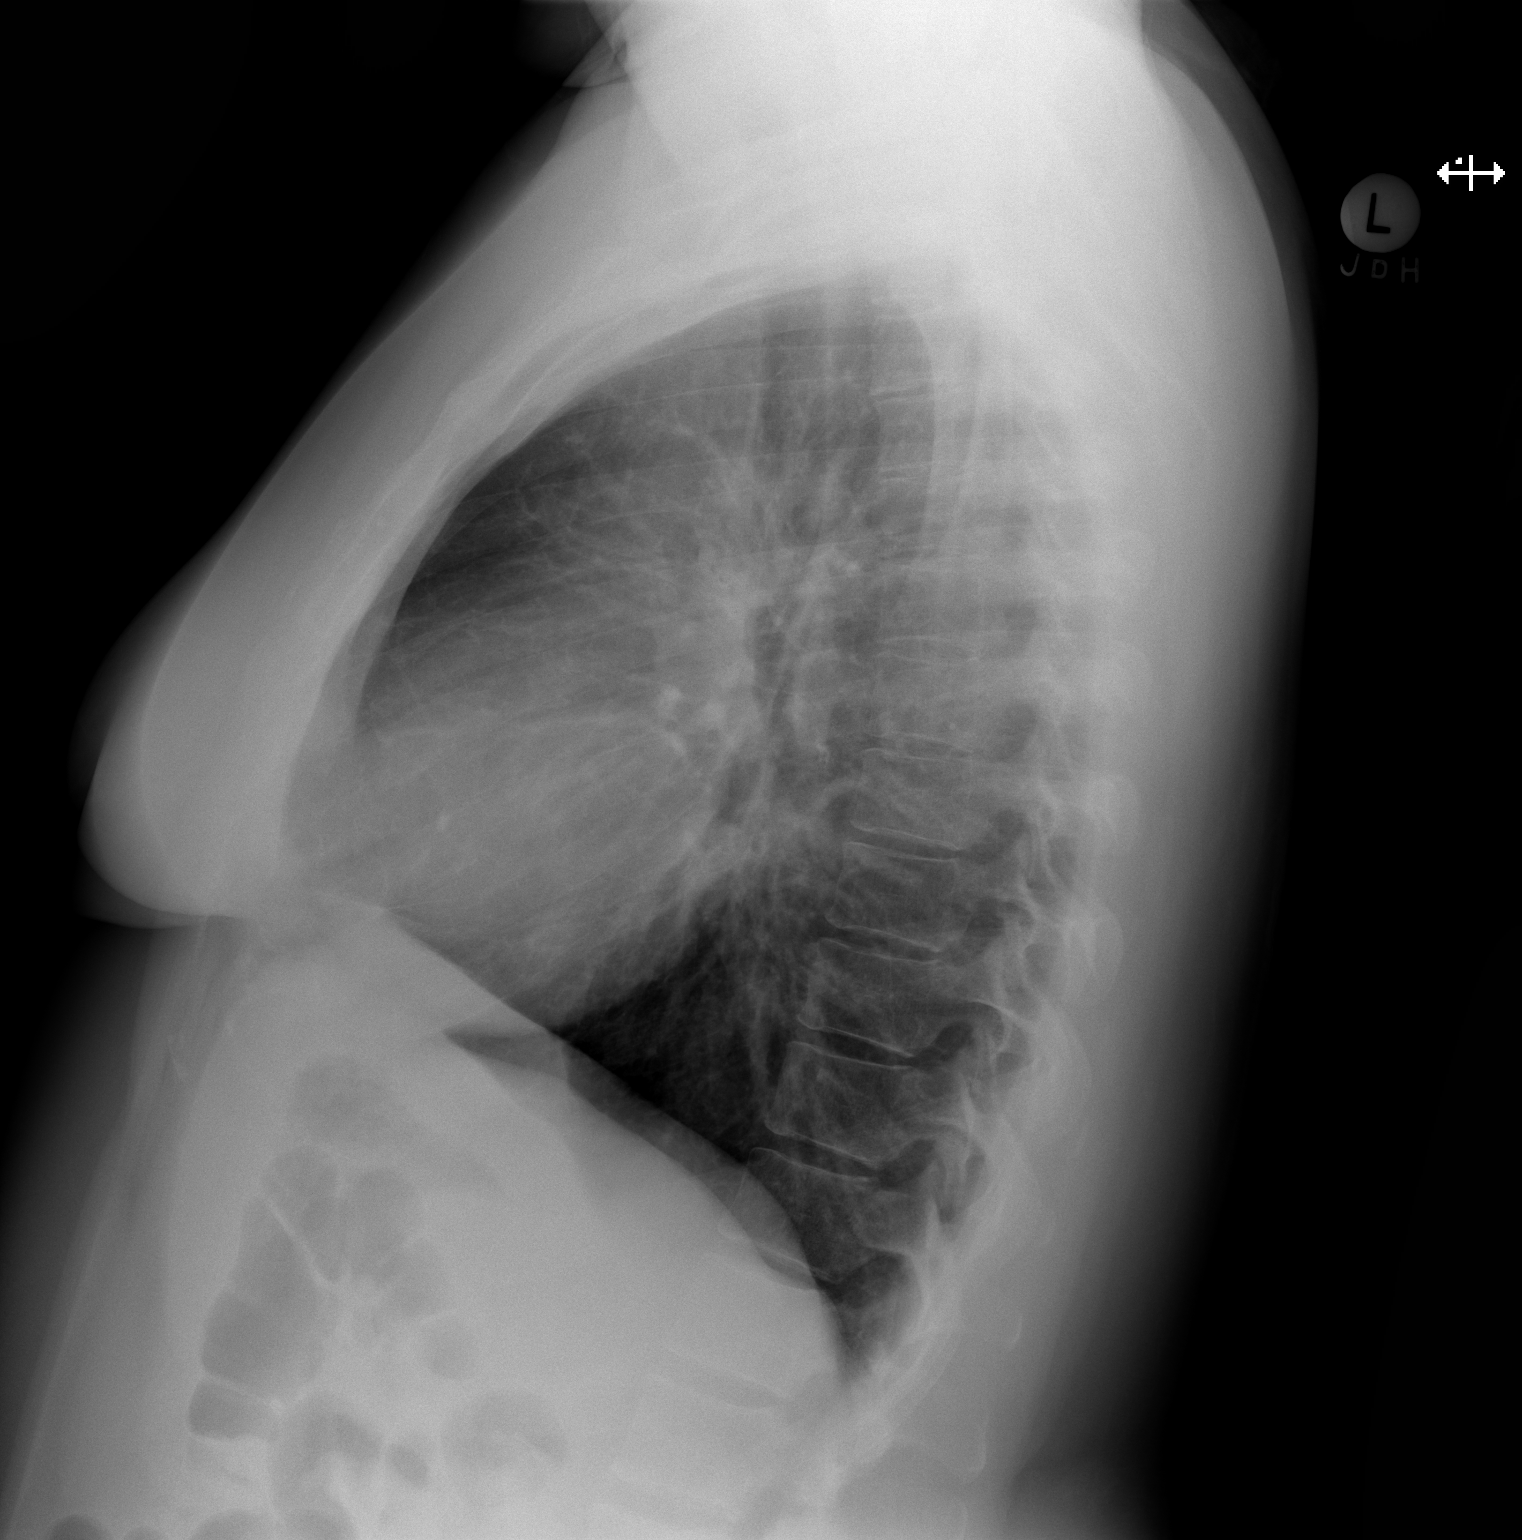

[2 of 2 positions shown; findings below may reference images not displayed]

FINDINGS: The heart size and mediastinal contours are within normal limits.
Both lungs are clear. The visualized skeletal structures are
unremarkable.
IMPRESSION: No active cardiopulmonary disease.

## 2019-05-26 NOTE — Telephone Encounter (Signed)
Patient has been approved to receive Faserna from [facility] through 11/17/19. Will need new prescription for pens send to program and to advise that shipment will go to patient's home- after she has been trained.

## 2019-05-29 ENCOUNTER — Telehealth: Payer: Self-pay | Admitting: Pulmonary Disease

## 2019-05-29 ENCOUNTER — Ambulatory Visit: Payer: Medicaid Other

## 2019-05-29 ENCOUNTER — Ambulatory Visit: Payer: Medicaid Other | Admitting: Adult Health

## 2019-05-29 NOTE — Telephone Encounter (Signed)
Harrington Challenger Order: 30mg  #1 prefilled syringe Ordered date: 05/29/19 Expected date of arrival: 3-5 days - per pharm review Ordered by: Shalan Neault,LPN Specialty Pharmacy: [facility]

## 2019-05-31 NOTE — Telephone Encounter (Signed)
Fasenra Shipment Received:  30mg  #1 prefilled syringe Medication arrival date: 05/31/19 Lot #: [id number] Exp date: 08/24/20 Received by: 08/26/20

## 2019-06-09 ENCOUNTER — Ambulatory Visit: Payer: Medicaid Other

## 2019-06-09 ENCOUNTER — Ambulatory Visit (INDEPENDENT_AMBULATORY_CARE_PROVIDER_SITE_OTHER): Payer: Medicaid Other

## 2019-06-09 ENCOUNTER — Other Ambulatory Visit: Payer: Self-pay

## 2019-06-09 DIAGNOSIS — J4550 Severe persistent asthma, uncomplicated: Secondary | ICD-10-CM

## 2019-06-09 MED ORDER — BENRALIZUMAB 30 MG/ML ~~LOC~~ SOSY
30.0000 mg | PREFILLED_SYRINGE | Freq: Once | SUBCUTANEOUS | Status: AC
  Administered 2019-06-09: 30 mg via SUBCUTANEOUS

## 2019-06-09 NOTE — Progress Notes (Signed)
Have you been hospitalized within the last 10 days?  No Do you have a fever?  No Do you have a cough?  No Do you have a headache or sore throat? No Do you have your Epi Pen visible and is it within date?  Yes 

## 2019-07-24 ENCOUNTER — Telehealth: Payer: Self-pay | Admitting: Pulmonary Disease

## 2019-07-24 NOTE — Telephone Encounter (Signed)
Harrington Challenger Order: 30mg  #1 prefilled syringe Ordered date: 07/24/2019 Expected date of arrival: 07/27/2019 Ordered by: 09/27/2019, CMA  Specialty Pharmacy: [facility]

## 2019-07-27 NOTE — Telephone Encounter (Signed)
Fasenra Shipment Received:  30mg  #1 prefilled syringe Medication arrival date: 07/27/2019 Lot #: [id number] Exp date: 09/2020 Received by: 10/2020, CMA

## 2019-08-04 ENCOUNTER — Other Ambulatory Visit: Payer: Self-pay

## 2019-08-04 ENCOUNTER — Ambulatory Visit (INDEPENDENT_AMBULATORY_CARE_PROVIDER_SITE_OTHER): Payer: Medicaid Other

## 2019-08-04 DIAGNOSIS — J4550 Severe persistent asthma, uncomplicated: Secondary | ICD-10-CM

## 2019-08-04 MED ORDER — BENRALIZUMAB 30 MG/ML ~~LOC~~ SOSY
30.0000 mg | PREFILLED_SYRINGE | Freq: Once | SUBCUTANEOUS | Status: AC
  Administered 2019-08-04: 30 mg via SUBCUTANEOUS

## 2019-08-04 NOTE — Progress Notes (Signed)
All questions were answered by the patient before medication was administered. Have you been hospitalized in the last 10 days? No Do you have a fever? No Do you have a cough? No Do you have a headache or sore throat? No  

## 2019-08-25 MED ORDER — NYSTATIN 100000 UNIT/ML MT SUSP: 5.0000 mL | Freq: Four times a day (QID) | OROMUCOSAL | 0 refills | Status: DC

## 2019-08-25 NOTE — Telephone Encounter (Signed)
02/24/2018  Patient is due for follow-up.  Please schedule with Dr. Everardo All.  Patient was due for follow-up in April/2021.  Okay to prescribe:  Nystatin 500,000 units suspension /100,000 units/mL >>>5 mL's every 6 hours for 7 days >>>Try to retain nystatin in mouth as long as possible  Elisha Headland FNP

## 2019-08-25 NOTE — Telephone Encounter (Signed)
Since we have not heard back from Dr. Everardo All, routing this to [place]. Arlys John, please advise.

## 2019-08-25 NOTE — Telephone Encounter (Signed)
mychart message received from pt stating that she has developed thrush again from her dulera inhaler. Pt is requesting to have Rx of nystatin sent to pharmacy to help with this. Dr. Everardo All, please advise.

## 2019-09-18 ENCOUNTER — Telehealth: Payer: Self-pay | Admitting: Pulmonary Disease

## 2019-09-18 NOTE — Telephone Encounter (Signed)
Fasenra Order: °30mg #1 prefilled syringe °Ordered date: 09/18/19 °Expected date of arrival: 3-5 business days °Ordered by: Lisa,LPN °Specialty Pharmacy: [facility] °

## 2019-09-20 NOTE — Telephone Encounter (Signed)
Fasenra Shipment Received:  °30mg #1 prefilled syringe °Medication arrival date: 09/20/19 °Lot #: [id number] °Exp date: 10/25/2020 °Received by: Lisa,LPN °

## 2019-09-29 ENCOUNTER — Other Ambulatory Visit: Payer: Self-pay

## 2019-09-29 ENCOUNTER — Ambulatory Visit (INDEPENDENT_AMBULATORY_CARE_PROVIDER_SITE_OTHER): Payer: Medicaid Other

## 2019-09-29 ENCOUNTER — Telehealth: Payer: Self-pay | Admitting: Pulmonary Disease

## 2019-09-29 DIAGNOSIS — J4550 Severe persistent asthma, uncomplicated: Secondary | ICD-10-CM

## 2019-09-29 MED ORDER — BENRALIZUMAB 30 MG/ML ~~LOC~~ SOSY
30.0000 mg | PREFILLED_SYRINGE | Freq: Once | SUBCUTANEOUS | Status: AC
  Administered 2019-09-29: 30 mg via SUBCUTANEOUS

## 2019-09-29 NOTE — Progress Notes (Signed)
Have you been hospitalized within the last 10 days?  No Do you have a fever?  No Do you have a cough?  No Do you have a headache or sore throat? No Do you have your Epi Pen visible and is it within date?  Yes   Patient did bring someone with her to learn about home injections. Fasenra demo pen was used and demonstrated.  Patient is requesting to transition to home injection.

## 2019-09-29 NOTE — Telephone Encounter (Signed)
Patient has been approved to receive Faserna from [employer] through 11/17/19. Will need new prescription for pens send to program and to advise that shipment will go to patient's home.  Also, Patient should complete new re-enrollment [employer] paperwork if she has not already started.

## 2019-09-29 NOTE — Telephone Encounter (Signed)
Patient came today for Fasenra injection.  Patient brought someone with her to learn about home injections.  Fasenra demo pen was shown and practiced with. Home injections questions answered. Patient is requesting Harrington Challenger pen, and to transition to home injections. Patient is scheduled 11/24/19, for next Fasenra injection, and would like to do self injection.  Message routed to pharmacy team, and Dr Everardo All

## 2019-10-03 MED ORDER — FASENRA PEN 30 MG/ML ~~LOC~~ SOAJ: 30.0000 mg | SUBCUTANEOUS | 5 refills | Status: DC

## 2019-10-03 NOTE — Telephone Encounter (Signed)
New Fasenra pen prescription sent to [facility] ([facility]). I spoke with [facility] Harrington Challenger representative stating Patient is needing to renew her Harrington Challenger assistance for 2021-2022. Patient can do this over the phone. Called and spoke with Patient.  [employer] Patient assistance contact number given.  Understanding stated. Nothing further needed at this time.

## 2019-10-03 NOTE — Telephone Encounter (Signed)
Alisha Harris, please let me know if there is any action needed on my end.  JE

## 2019-10-27 ENCOUNTER — Other Ambulatory Visit: Payer: Self-pay

## 2019-10-27 MED ORDER — DULERA 200-5 MCG/ACT IN AERO: INHALATION_SPRAY | RESPIRATORY_TRACT | 3 refills | Status: DC

## 2019-10-27 NOTE — Telephone Encounter (Signed)
Called and spoke with Patient. Patient enrollment and Patient assistance  for [facility] Fasenra mailed to patient.  Patient can complete high lighted section and mail for Pharmacy Team.

## 2019-11-15 NOTE — Telephone Encounter (Signed)
Pharmacy team received signed manufacturer forms, but not signed [employer] patient assistance forms. Called program, they have not received, but patient can complete over the phone.  Called patient, left message to cal [employer] to complete.  Phone# [phone]

## 2019-11-20 NOTE — Telephone Encounter (Signed)
Called [facility] to set up [facility] delivery.  Call was disconnected.  Called back, but was placed on hold. Will follow up with shipment.

## 2019-11-21 NOTE — Telephone Encounter (Signed)
Harrington Challenger Order: 30mg  #1 prefilled syringe Ordered date: 11/21/19 Expected date of arrival: 11/24/19-11/28/19 Ordered by: Jayron Maqueda,LPN Specialty Pharmacy: [facility]  Will ship 3-5 business days  ATC Patient to reschedule injection training appointment to after 11/28/19. LM to call back.

## 2019-11-22 ENCOUNTER — Telehealth: Payer: Self-pay | Admitting: Pulmonary Disease

## 2019-11-22 NOTE — Telephone Encounter (Signed)
Received notification from [employer] regarding an approval for [facility] patient assistance from 11/18/19 to 11/16/20.   Phone number: [phone]

## 2019-11-23 NOTE — Telephone Encounter (Signed)
ATC Patient.  LM on VM to let Patient know to come in for 0900 injection appointment, as scheduled.  Alisha Harris was delivered to Patient.

## 2019-11-23 NOTE — Telephone Encounter (Signed)
ATC Patient.  LM on VM to let Patient know to come in for 0900 injection appointment, as scheduled.  Fasenra was delivered to Patient. 

## 2019-11-24 ENCOUNTER — Other Ambulatory Visit: Payer: Self-pay

## 2019-11-24 ENCOUNTER — Ambulatory Visit (INDEPENDENT_AMBULATORY_CARE_PROVIDER_SITE_OTHER): Payer: Medicaid Other

## 2019-11-24 DIAGNOSIS — J4550 Severe persistent asthma, uncomplicated: Secondary | ICD-10-CM | POA: Diagnosis not present

## 2019-11-24 MED ORDER — BENRALIZUMAB 30 MG/ML ~~LOC~~ SOSY
30.0000 mg | PREFILLED_SYRINGE | Freq: Once | SUBCUTANEOUS | Status: AC
  Administered 2019-11-24: 30 mg via SUBCUTANEOUS

## 2019-11-24 MED ORDER — DULERA 200-5 MCG/ACT IN AERO: INHALATION_SPRAY | RESPIRATORY_TRACT | 1 refills | Status: DC

## 2019-11-24 NOTE — Progress Notes (Signed)
Have you been hospitalized within the last 10 days?  No Do you have a fever?  No Do you have a cough?  No Do you have a headache or sore throat? No Do you have your Epi Pen visible and is it within date?  Yes   Patient shown on [employer] for self injection.  Patient brought fiance to learn Fasenra injections.  Patient given Specialty pharmacy contact information given.  Next injection time reviewed.

## 2019-12-19 ENCOUNTER — Other Ambulatory Visit: Payer: Self-pay | Admitting: Pulmonary Disease

## 2019-12-27 ENCOUNTER — Other Ambulatory Visit: Payer: Self-pay | Admitting: Pulmonary Disease

## 2020-01-22 ENCOUNTER — Telehealth: Payer: Self-pay | Admitting: Internal Medicine

## 2020-01-22 NOTE — Telephone Encounter (Signed)
Called [employer] to follow up on Patient Alisha Harris home delivery. Per [place] [facility] [place] representative, Patient never called to schedule this shipment.  [place] scheduled Harrington Challenger to be delivery to Patient home address for 3-5 business days. Patient aware of shipment date.  Also reviewed when to call and schedule home shipment for [place] delivery. [facility] contact info given to Patient. Nothing further at this time.

## 2020-02-19 ENCOUNTER — Telehealth: Payer: Self-pay | Admitting: Pulmonary Disease

## 2020-02-19 NOTE — Telephone Encounter (Signed)
Called and spoke with Patient.  Patient requested a prescription of Nystatin to be sent to [facility] [place]. I asked Patient if she had been rinsing good after inhaler use? Patient stated she wasn't feeling well last week and used her inhaler during the night, without rinsing.  Patient stated she currently has thrush in her mouth. Patient stated she does not get off work until 5:30pm, and request return call, recommendations to be left on VM.  Message routed to Dr. Everardo All

## 2020-02-20 ENCOUNTER — Other Ambulatory Visit: Payer: Self-pay | Admitting: Pulmonary Disease

## 2020-02-20 MED ORDER — NYSTATIN 100000 UNIT/ML MT SUSP
5.0000 mL | Freq: Four times a day (QID) | OROMUCOSAL | 0 refills | Status: DC
Start: 2020-02-20 — End: 2020-03-08

## 2020-02-20 NOTE — Telephone Encounter (Signed)
Alisha Harris has not been seen in the office since 11/2018 telephone encounter and last physical encounter in the Pulmonary office was 11/15/2018 with a physician. However she is receiving Fasenra for severe persistent asthma.  Please schedule patient for follow-up with me when next available. Ok to fill Nystatin now but patient needs routine follow-up when on biologic agent  Mechele Collin, M.D. [facility] Pulmonary/Critical Care Medicine 02/20/2020 8:52 AM

## 2020-02-20 NOTE — Telephone Encounter (Signed)
Spoke with the Alisha Harris  Advised will refill Nystatin and needs ov with Dr Everardo All  I have sent refill  She states that she needs to call back today while she is on her lunch break to schedule appt, needs to look at her schedule  Will keep open to ensure that this happens and if not will try calling her again

## 2020-02-21 NOTE — Telephone Encounter (Signed)
Checked to see if pt called office to make an appt and she did not.  Called and spoke with pt to see about getting appt scheduled. Appt has been made for pt with Dr. Everardo All. Nothing further needed.

## 2020-03-08 ENCOUNTER — Other Ambulatory Visit: Payer: Self-pay

## 2020-03-08 ENCOUNTER — Ambulatory Visit: Payer: Medicaid Other | Admitting: Pulmonary Disease

## 2020-03-08 ENCOUNTER — Encounter: Payer: Self-pay | Admitting: Pulmonary Disease

## 2020-03-08 VITALS — BP 128/78 | HR 94 | Temp 98.1°F | Ht 65.0 in | Wt 251.0 lb

## 2020-03-08 DIAGNOSIS — J4550 Severe persistent asthma, uncomplicated: Secondary | ICD-10-CM

## 2020-03-08 MED ORDER — NYSTATIN 100000 UNIT/ML MT SUSP
5.0000 mL | Freq: Four times a day (QID) | OROMUCOSAL | 0 refills | Status: DC
Start: 2020-03-08 — End: 2020-03-22

## 2020-03-08 MED ORDER — DULERA 200-5 MCG/ACT IN AERO
INHALATION_SPRAY | RESPIRATORY_TRACT | 12 refills | Status: DC
Start: 2020-03-08 — End: 2021-04-11

## 2020-03-08 MED ORDER — MONTELUKAST SODIUM 10 MG PO TABS
10.0000 mg | ORAL_TABLET | Freq: Every day | ORAL | 3 refills | Status: DC
Start: 2020-03-08 — End: 2020-09-06

## 2020-03-08 NOTE — Progress Notes (Signed)
Synopsis: Referred in 10/2017 for severe persistent asthma  Subjective:   PATIENT ID: Alisha Harris GENDER: female DOB: 09-21-1990, MRN: [id number]   HPI  Chief Complaint  Patient presents with  . Follow-up    No c/o    Ms. Gerry Heaphy is a 30 year old female with history of childhood asthma, allergic rhinitis who presents for follow-up of severe persistent asthma. Started on [place] in 01/2018.  She reports she is well-controlled on her Harrington Challenger. Last asthma exacerbation was on October 2019. She is compliant with her Harrington Challenger every two months. She usually is compliant with her Elwin Sleight however has had thrush for the last two weeks and has held off. When she does use it, it does provide benefit. Has not had to use her rescue inhaler. Denies nocturnal symptoms. When she exerts herself or when she is close to needing her next injection she will have shortness of breath. No wheezing. Maybe a component of anxiety per her. Allergies are well-controlled on Singulair and zyrtec.  Symptoms are usually triggered by seasonal allergies, animal dander, grass, mildew and illness. For allergies, she takes with montelukast and zyrtec.  Current asthma medications: Dulera 200-5 2 puffs twice daily Montelukast 10 mg daily Zyertec daily PRN albuterol  Prior medications: Symbicort - no improvement per patient  Steroid Hx:  2018- October XX 2019 Jan Feb March April May June July Aug Sept Oct Nov Dec   X       X X XXX    2020 Jan Feb March April May June July Aug Sept Oct Nov Dec                 2021 Jan Feb March April May June July Aug Sept Oct Nov Dec                2022 Jan Feb March April May June July Aug Sept Oct Nov Dec                  Age of tobacco initiation: 30 years old  Age of tobacco cessation: 30 years old Average packs/day: 1/2 ppd  Social History: She works in the office for [employer] She is a Interior and spatial designer on weekends  I have personally reviewed patient's past  medical/family/social history/allergies/current medications.  Past Medical History:  Diagnosis Date  . Anxiety   . Asthma     No Known Allergies   Outpatient Medications Prior to Visit  Medication Sig Dispense Refill  . albuterol (PROVENTIL HFA;VENTOLIN HFA) 108 (90 Base) MCG/ACT inhaler Inhale 2 puffs into the lungs every 6 (six) hours as needed for wheezing or shortness of breath. 1 Inhaler 1  . amphetamine-dextroamphetamine (ADDERALL) 20 MG tablet Take 20 mg by mouth 2 (two) times daily.     . Benralizumab (FASENRA PEN) 30 MG/ML SOAJ Inject 30 mg into the skin every 8 (eight) weeks. 0.28 mL 5  . buPROPion (WELLBUTRIN XL) 150 MG 24 hr tablet Take 150 mg by mouth daily.     . cetirizine (ZYRTEC) 10 MG tablet Take 10 mg by mouth daily.     . mometasone-formoterol (DULERA) 200-5 MCG/ACT AERO USE 2 PUFFS TWICE DAILY 13 g 1  . montelukast (SINGULAIR) 10 MG tablet Take 1 tablet (10 mg total) by mouth at bedtime. 60 tablet 3  . azithromycin (ZITHROMAX) 250 MG tablet 500mg  (two tablets) today, then 250mg  (1 tablet) for the next 4 days 6 tablet 0  . clotrimazole (MYCELEX) 10 MG troche Take 1 tablet (  10 mg total) by mouth 5 (five) times daily. 35 tablet 0  . FASENRA 30 MG/ML SOSY INJECT 30 MG UNDER THE SKIN AT WEEKS 0, 4, AND 8 THEN ONCE EVERY 8 WEEKS 1 mL 13  . mometasone (NASONEX) 50 MCG/ACT nasal spray Place 1 spray into the nose daily.     Marland Kitchen nystatin (MYCOSTATIN) 100000 UNIT/ML suspension Take 5 mLs (500,000 Units total) by mouth 4 (four) times daily. Try to hold medication in mouth for as long as possible (Patient not taking: Reported on 03/08/2020) 140 mL 0  . predniSONE (DELTASONE) 10 MG tablet 4 tabs for 2 days, then 3 tabs for 2 days, 2 tabs for 2 days, then 1 tab for 2 days, then stop 20 tablet 0   No facility-administered medications prior to visit.    Review of Systems  Constitutional: Negative for chills, diaphoresis, fever, malaise/fatigue and weight loss.  HENT: Negative for  congestion.   Respiratory: Positive for shortness of breath. Negative for cough, hemoptysis, sputum production and wheezing.   Cardiovascular: Negative for chest pain, palpitations and leg swelling.    Objective:   Vitals:   03/08/20 1427  BP: 128/78  Pulse: 94  Temp: 98.1 F (36.7 C)  TempSrc: Temporal  SpO2: 98%  Weight: 251 lb (113.9 kg)  Height: 5\' 5"  (1.651 m)   Physical Exam: General: Well-appearing, no acute distress HENT: [place], AT Eyes: EOMI, no scleral icterus Respiratory: Clear to auscultation bilaterally.  No crackles, wheezing or rales Cardiovascular: RRR, -M/R/G, no JVD Extremities:-Edema,-tenderness Neuro: AAO x4, CNII-XII grossly intact Psych: Normal mood, normal affect  Data Reviewed:  Chest imaging: CXR 01/27/17 - No acute abnormalities. No edema, effusion or infiltrate  PFT:  11/29/2017 Interpretation: On ICS inhaler. Mild obstructive defect with FEV1 102%.  No significant bronchodilator effect.  Normal lung volumes and DLCO.  Imaging, labs and test noted above have been reviewed independently by me.    Assessment & Plan:   30 year old female with history of childhood asthma, allergic rhinitis who presents for follow-up of severe persistent asthma. Started on 38 in 01/2018. Reviewed prior documentation including phone notes. Well-controlled on current regimen. Discussed asthma action plan. Rx for thrush.  Severe Persistent Asthma - well controlled CONTINUE Dulera 200-5 mcg 2 puffs twice a day. Provide patient assistance program information.  CONTINUE Albuterol every 4 hours as needed for shortness of breath or wheezing CONTINUE Singulair 10 mg daily CONTINUE Zyrtec daily CONTINUE Fasenra injections as scheduled  Asthma Action Plan Increase Dulera to 2 puffs three times a day for worsening shortness of breath, wheezing and cough. If you symptoms do not improve in 24-48 hours, please our office for evaluation and/or prednisone taper.  Thrush REFILL  Nystatin  Immunization History  Administered Date(s) Administered  . Influenza Split 10/17/2018  . Influenza Whole 10/17/2018  . Influenza,inj,Quad PF,6-35 Mos 01/05/2020  . Influenza-Unspecified 01/05/2020   Meds ordered this encounter  Medications  . nystatin (MYCOSTATIN) 100000 UNIT/ML suspension    Sig: Take 5 mLs (500,000 Units total) by mouth 4 (four) times daily. Try to hold medication in mouth for as long as possible    Dispense:  140 mL    Refill:  0   No orders of the defined types were placed in this encounter.  Return in about 6 months (around 09/05/2020).  I have spent a total time of 31-minutes on the day of the appointment reviewing prior documentation, coordinating care and discussing medical diagnosis and plan with the patient/family.  Imaging, labs and tests included in this note have been reviewed and interpreted independently by me.  Mechele Collin, M.D. [facility] Pulmonary/Critical Care Medicine 03/08/2020 2:34 PM

## 2020-03-08 NOTE — Patient Instructions (Signed)
  Severe Persistent Asthma  CONTINUE Dulera 200-5 mcg 2 puffs twice a day. Provide patient assistance program information.  CONTINUE Albuterol every 4 hours as needed for shortness of breath or wheezing CONTINUE Singulair 10 mg daily CONTINUE Zyrtec daily CONTINUE Fasenra injections as scheduled  Asthma Action Plan Increase Dulera to 2 puffs three times a day for worsening shortness of breath, wheezing and cough. If you symptoms do not improve in 24-48 hours, please our office for evaluation and/or prednisone taper.  Thrush REFILL Nystatin  Follow-up in 6 months with me

## 2020-03-22 MED ORDER — CLOTRIMAZOLE 10 MG MT TROC: 10.0000 mg | Freq: Every day | OROMUCOSAL | 0 refills | Status: AC

## 2020-03-22 NOTE — Telephone Encounter (Signed)
Clotrimazole has been sent to her pharmacy. Please let her know this has been sent. I have ordered 10 day course but recommend taking at least seven days before discontinuing if her symptoms are improving. Please also let be aware that this medication should not be taken while pregnant.  If this does not improve her symptoms, she will need to be seen by PCP or our office for evaluation.

## 2020-03-22 NOTE — Telephone Encounter (Signed)
Received the following message from patient:   "I am so sorry to bother you but I was prescribed a second round of nystatin for thrush when I came into the office February 11 and I am out of this prescription again and there has been no change at all, is there something else I can do possibly? the taste is unbearable."   Dr. Everardo All, can you please advise? Thanks!

## 2020-03-27 ENCOUNTER — Encounter: Payer: Self-pay | Admitting: Pulmonary Disease

## 2020-04-02 MED ORDER — PREDNISONE 10 MG PO TABS: ORAL_TABLET | ORAL | 0 refills | Status: AC

## 2020-04-02 MED ORDER — AMOXICILLIN-POT CLAVULANATE 875-125 MG PO TABS: 1.0000 | ORAL_TABLET | Freq: Two times a day (BID) | ORAL | 0 refills | Status: DC

## 2020-04-02 NOTE — Telephone Encounter (Signed)
[place] Telephone Encounter  Discussed with patient regarding productive cough and shortness of breath x 72 hours.  Assessment/Plan  Asthma exacerbation - Start prednisone taper. Rx Augmentin with recommendation to start if symptoms worsen or do not improved after 48 hours on steroids. Advised to call if symptoms not improved for telephone/video visit.  Mechele Collin, M.D. [facility] Pulmonary/Critical Care Medicine 04/02/2020 4:46 PM

## 2020-04-02 NOTE — Telephone Encounter (Signed)
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   Patient is taking singulair and claritin daily.  She said it is more in her chest than her nose. She took a covid test yesterday and today both negative. Symptoms started 2 days ago.   May call patient and leave detailed message she gets on her watch. She is done with lunch at 1pm  Will send to Dr. Everardo All for further recommendations.

## 2020-09-06 ENCOUNTER — Ambulatory Visit (INDEPENDENT_AMBULATORY_CARE_PROVIDER_SITE_OTHER): Payer: Medicaid Other | Admitting: Pulmonary Disease

## 2020-09-06 ENCOUNTER — Telehealth: Payer: Self-pay | Admitting: *Deleted

## 2020-09-06 ENCOUNTER — Telehealth: Payer: Self-pay | Admitting: Pulmonary Disease

## 2020-09-06 ENCOUNTER — Encounter: Payer: Self-pay | Admitting: Pulmonary Disease

## 2020-09-06 ENCOUNTER — Other Ambulatory Visit: Payer: Self-pay

## 2020-09-06 VITALS — BP 118/76 | HR 87 | Temp 98.2°F | Ht 65.0 in | Wt 246.0 lb

## 2020-09-06 DIAGNOSIS — J4551 Severe persistent asthma with (acute) exacerbation: Secondary | ICD-10-CM

## 2020-09-06 DIAGNOSIS — B370 Candidal stomatitis: Secondary | ICD-10-CM | POA: Diagnosis not present

## 2020-09-06 MED ORDER — NYSTATIN 100000 UNIT/ML MT SUSP: 5.0000 mL | Freq: Four times a day (QID) | OROMUCOSAL | 0 refills | Status: AC

## 2020-09-06 MED ORDER — SPACER/AERO-HOLDING CHAMBERS DEVI: 1.0000 | 0 refills | Status: AC | PRN

## 2020-09-06 MED ORDER — MONTELUKAST SODIUM 10 MG PO TABS: 10.0000 mg | ORAL_TABLET | Freq: Every day | ORAL | 3 refills | Status: DC

## 2020-09-06 MED ORDER — PREDNISONE 10 MG PO TABS: ORAL_TABLET | ORAL | 0 refills | Status: AC

## 2020-09-06 NOTE — Patient Instructions (Signed)
Severe Persistent Asthma - not controlled, active exacerbation START prednisone taper as directed CONTINUE Dulera 200-5 mcg 2 puffs twice a day CONTINUE Albuterol every 4 hours as needed for shortness of breath or wheezing CONTINUE Singulair 10 mg daily. Refilled CONTINUE Zyrtec daily CONTINUE Fasenra injections as scheduled. Provided injection pen in-clinic. ORDER spacer to use   Asthma Action Plan Increase Dulera to 2 puffs three times a day for worsening shortness of breath, wheezing and cough. If you symptoms do not improve in 24-48 hours, please our office for evaluation and/or prednisone taper.  Oral Thrush Nystatin suspension   Follow-up with me in 3 months

## 2020-09-06 NOTE — Telephone Encounter (Signed)
Spoke with pharmacist [facility], advised that they do have samples of Fasenra.  Patient provided with a sample of the Harrington Challenger today while in the clinic for her OV.  She was educated on proper places to inject fasenra.  Nothing further needed.

## 2020-09-06 NOTE — Telephone Encounter (Signed)
ATC patient x1, no answer.  Advised script was sent again to her pharmacy and to let us know if there are any other issues.  LVM to return call on Monday as our phones are now off.

## 2020-09-06 NOTE — Progress Notes (Signed)
Patient states she tried to administer her Fasenra dose on 09/04/20 and pen malfunctioned. Her pharmacy is an independent pharmacy and is unable to fill again.  Upon further questioning, she states her partner pinches the skin and she administers the dose in the back of her arm. I advised that this is not an appropriate injection site location for self-administration. Reviewed that this site is only appropriate if a caregiver is administered the medication to her on her behalf - she states that her partner refuses to administer.  Advised that she should only self-administer in abdomen at least 2-3 inches away from belly button or upper thigh moving forward.  She will plan to administer dose today in her upper thigh and confirmed location with me.  She was provided with Harrington Challenger 30mg /mL autoinjector pen sample today. NDC: Lot: [id number] Exp: 03/2022  04/2022, PharmD, MPH, BCPS Clinical Pharmacist (Rheumatology and Pulmonology)

## 2020-09-06 NOTE — Progress Notes (Signed)
Synopsis: Referred in 10/2017 for severe persistent asthma  Subjective:   PATIENT ID: Alisha Harris GENDER: female DOB: [birth year], MRN: [id number]   HPI  Chief Complaint  Patient presents with   Follow-up    Recently asthma has gotten worsen, Injection didn't work Wednesday bad defect with syringe have taken steroid since last visit respiratory infection.   Alisha Harris is a 30 year old female with history of childhood asthma, allergic rhinitis who presents for follow-up of severe persistent asthma. Started on [place] in 01/2018.  She reports she is well-controlled on her Harrington Challenger. Last asthma exacerbation was on October 2019. She is compliant with her Harrington Challenger every two months. She usually is compliant with her Elwin Sleight however has had thrush for the last two weeks and has held off. When she does use it, it does provide benefit. Has not had to use her rescue inhaler. Denies nocturnal symptoms. When she exerts herself or when she is close to needing her next injection she will have shortness of breath. No wheezing. Maybe a component of anxiety per her. Allergies are well-controlled on Singulair and zyrtec.  Symptoms are usually triggered by seasonal allergies, animal dander, grass, mildew and illness. For allergies, she takes with montelukast and zyrtec.  09/06/20 Since our last visit, she went to the beach in a condo in [place] for the Fourth. Last month she started on amoxicillin and steroids by her PCP. She completed a 10 day course of steroids. Two days ago, she attempted to self administer her Harrington Challenger however equipment was faulty and she did not receive the medication. She is currently on the 8 week rotation. She also not been taking her Dulera x 1 month due to severe thrush. She restarted Dulera this week. She is using Albuterol as needed and not needed it in the last week. Compliant with singulair. She has shortness of breath and wheezing but does not limit her activity  Current  asthma medications: Dulera 200-5 2 puffs twice daily Montelukast 10 mg daily Zyertec daily PRN albuterol  Prior medications: Symbicort - no improvement per patient  Steroid Hx:  2018- October XX 2019 Jan Feb March April May June July Aug Sept Oct Nov Dec   X       X X XXX    2020 Jan Feb March April May June July Aug Sept Oct Nov Dec                 2021 Jan Feb March April May June July Aug Sept Oct Nov Dec                2022 Jan Feb March April May June July Aug Sept Oct Nov Dec         X         Age of tobacco initiation: 30 years old  Age of tobacco cessation: 30 years old Average packs/day: 1/2 ppd  Social History: 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      Benralizumab (FASENRA PEN) 30 MG/ML SOAJ Inject 30 mg into the skin every 8 (eight) weeks. 0.28 mL 5   buPROPion (WELLBUTRIN XL) 150 MG 24 hr tablet  Take 150 mg by mouth daily.      cetirizine (ZYRTEC) 10 MG tablet Take 10 mg by mouth daily.      FASENRA 30 MG/ML SOSY INJECT 30 MG UNDER THE SKIN AT WEEKS 0, 4, AND 8 THEN ONCE EVERY 8 WEEKS 1 mL 13   montelukast (SINGULAIR) 10 MG tablet Take 1 tablet (10 mg total) by mouth at bedtime. 60 tablet 3   paragard intrauterine copper IUD IUD by Intrauterine route.     vitamin B-12 (CYANOCOBALAMIN) 500 MCG tablet Take by mouth.     albuterol (PROVENTIL HFA;VENTOLIN HFA) 108 (90 Base) MCG/ACT inhaler Inhale 2 puffs into the lungs every 6 (six) hours as needed for wheezing or shortness of breath. (Patient not taking: Reported on 09/06/2020) 1 Inhaler 1   doxycycline (VIBRA-TABS) 100 MG tablet Take 100 mg by mouth 2 (two) times daily.     mometasone-formoterol (DULERA) 200-5 MCG/ACT AERO USE  2 PUFFS TWICE DAILY (Patient not taking: Reported on 09/06/2020) 13 g 12   amoxicillin-clavulanate (AUGMENTIN) 875-125 MG tablet Take 1 tablet by mouth 2 (two) times daily. 14 tablet 0   No facility-administered medications prior to visit.   Review of Systems  Constitutional:  Negative for chills, diaphoresis, fever, malaise/fatigue and weight loss.  HENT:  Negative for congestion.   Respiratory:  Positive for shortness of breath and wheezing. Negative for cough, hemoptysis and sputum production.   Cardiovascular:  Negative for chest pain, palpitations and leg swelling.    Objective:   Vitals:   09/06/20 0922  BP: 118/76  Pulse: 87  Temp: 98.2 F (36.8 C)  TempSrc: Oral  SpO2: 97%  Weight: 246 lb (111.6 kg)  Height: 5\' 5"  (1.651 m)   Physical Exam: General: Well-appearing, no acute distress HENT: [place], AT, mild oral thrush involving tongue Eyes: EOMI, no scleral icterus Respiratory: Decreased air entry with expiratory wheezing bilaterally.  No crackles or rales Cardiovascular: RRR, -M/R/G, no JVD Extremities:-Edema,-tenderness Neuro: AAO x4, CNII-XII grossly intact Psych: Normal mood, normal affect  Data Reviewed:  Chest imaging: CXR 01/27/17 - No acute abnormalities. No edema, effusion or infiltrate  PFT:  11/29/2017 Interpretation: On ICS inhaler. Mild obstructive defect with FEV1 102%.  No significant bronchodilator effect.  Normal lung volumes and DLCO.     Assessment & Plan:   30 year old female with history of childhood asthma and allergic rhinitis who presents for follow-up of severe persistent asthma. Symptoms and exam consistent with active exacerbation and oral thrush. Not controlled. Missed Fasenra injection due to faulty device. Contacted infusion team and obtained additional Fasenra pen for administration in-office. We reviewed management of her bronchodilators and biologic. We reviewed asthma action plan.  Severe Persistent Asthma - not controlled, active  exacerbation START prednisone taper as directed CONTINUE Dulera 200-5 mcg 2 puffs twice a day CONTINUE Albuterol every 4 hours as needed for shortness of breath or wheezing CONTINUE Singulair 10 mg daily. Refilled CONTINUE Zyrtec daily CONTINUE Fasenra injections as scheduled. Provided injection pen in-clinic. ORDER spacer to use   Asthma Action Plan Increase Dulera to 2 puffs three times a day for worsening shortness of breath, wheezing and cough. If you symptoms do not improve in 24-48 hours, please our office for evaluation and/or prednisone taper.  Oral Thrush Nystatin suspension   Immunization History  Administered Date(s) Administered   Influenza Split 10/17/2018   Influenza Whole 10/17/2018   Influenza,inj,Quad PF,6-35 Mos 01/05/2020   Influenza-Unspecified 01/05/2020   Meds ordered this encounter  Medications  montelukast (SINGULAIR) 10 MG tablet    Sig: Take 1 tablet (10 mg total) by mouth at bedtime.    Dispense:  60 tablet    Refill:  3   predniSONE (DELTASONE) 10 MG tablet    Sig: Take 4 tablets (40 mg total) by mouth daily with breakfast for 2 days, THEN 3 tablets (30 mg total) daily with breakfast for 2 days, THEN 2 tablets (20 mg total) daily with breakfast for 2 days, THEN 1 tablet (10 mg total) daily with breakfast for 2 days.    Dispense:  20 tablet    Refill:  0   No orders of the defined types were placed in this encounter.  Return in about 3 months (around 12/07/2020).  I have spent a total time of 45-minutes on the day of the appointment reviewing prior documentation, coordinating care and discussing medical diagnosis and plan with the patient/family. Past medical history, allergies, medications were reviewed. Pertinent imaging, labs and tests included in this note have been reviewed and interpreted independently by me.   Mechele Collin, M.D. [facility] Pulmonary/Critical Care Medicine 09/06/2020 9:31 AM

## 2020-10-06 ENCOUNTER — Encounter: Payer: Self-pay | Admitting: Pulmonary Disease

## 2020-10-30 ENCOUNTER — Telehealth: Payer: Self-pay | Admitting: Pulmonary Disease

## 2020-10-30 DIAGNOSIS — J4551 Severe persistent asthma with (acute) exacerbation: Secondary | ICD-10-CM

## 2020-10-31 MED ORDER — FASENRA PEN 30 MG/ML ~~LOC~~ SOAJ: 30.0000 mg | SUBCUTANEOUS | 2 refills | Status: DC

## 2020-10-31 NOTE — Telephone Encounter (Signed)
Rx for Fasenra 30mg  every 8 weeks sent to [facility]. Patient receives through [facility] PAP.  She was last seen on 09/06/20 by Dr. 11/06/20 with plan to continue [place].  [place], PharmD, MPH, BCPS Clinical Pharmacist (Rheumatology and Pulmonology)

## 2020-10-31 NOTE — Telephone Encounter (Signed)
Mychart message sent by pt: Alisha Harris"  P [facility] (supporting Luciano Cutter, MD) 1 hour ago (3:06 PM)    I called your office yesterday and a lady in the front office said that they sent back a message to you regarding the status of my fascenra. I have been unable to order my injection through [facility] and once I looked online at the status it says "Prescription pending from Doctor". I have been calling [facility] for almost 2 weeks now and the automated system gets me nowhere it keeps saying my fascenra was refilled in August and was returned where I sent back the faulty injection that I had an issue with last time that we spoke about at my last appt. My fascenra injection is suppose to be tomorrow and I do not know what to do nor have I heard back from your office on the message that was supposedly sent back!!   Please advise.

## 2020-11-01 NOTE — Telephone Encounter (Signed)
Returned call to patient. She states her boyfriend, Adrian Prince, will stop by clinic to pick up Fasenra pen sample. She has been advised that he will have to provider patient's name. She has been advised that he must come to clinic before 3:30pm.  Routing to Arminda Resides who will provide sample  Chesley Mires, PharmD, MPH, BCPS Clinical Pharmacist (Rheumatology and Pulmonology)

## 2020-11-13 ENCOUNTER — Telehealth: Payer: Self-pay | Admitting: Pharmacy Technician

## 2020-11-13 NOTE — Telephone Encounter (Signed)
Received a fax from  [employer] regarding an approval for [facility] patient assistance from 11/12/20 to 11/12/21.   ID# [id number]  Phone number: [phone]

## 2020-11-29 ENCOUNTER — Encounter: Payer: Self-pay | Admitting: Pulmonary Disease

## 2020-11-29 ENCOUNTER — Other Ambulatory Visit: Payer: Self-pay

## 2020-11-29 ENCOUNTER — Ambulatory Visit (INDEPENDENT_AMBULATORY_CARE_PROVIDER_SITE_OTHER): Payer: Medicaid Other | Admitting: Pulmonary Disease

## 2020-11-29 VITALS — BP 118/70 | HR 89 | Temp 98.6°F | Ht 65.5 in | Wt 252.0 lb

## 2020-11-29 DIAGNOSIS — J4550 Severe persistent asthma, uncomplicated: Secondary | ICD-10-CM | POA: Diagnosis not present

## 2020-11-29 NOTE — Patient Instructions (Signed)
Severe Persistent Asthma  CONTINUE Dulera 200-5 mcg 2 puffs twice a day CONTINUE Albuterol every 4 hours as needed for shortness of breath or wheezing CONTINUE Singulair 10 mg daily CONTINUE Zyrtec daily CONTINUE Fasenra injections as scheduled CONTINUE spacer to use   Follow-up with me in 6 months

## 2020-11-29 NOTE — Progress Notes (Signed)
Synopsis: Referred in 10/2017 for severe persistent asthma  Subjective:   PATIENT ID: Alisha Harris GENDER: female DOB: January 21, 1991, MRN: [id number]   HPI  Chief Complaint  Patient presents with   Follow-up    asthma   [name] is a 30 year old female with history of childhood asthma, allergic rhinitis who presents for follow-up of severe persistent asthma. Started on [place] in 01/2018.  She reports she is well-controlled on her Harrington Challenger. Last asthma exacerbation was on October 2019. She is compliant with her Harrington Challenger every two months. She usually is compliant with her Elwin Sleight however has had thrush for the last two weeks and has held off. When she does use it, it does provide benefit. Has not had to use her rescue inhaler. Denies nocturnal symptoms. When she exerts herself or when she is close to needing her next injection she will have shortness of breath. No wheezing. Maybe a component of anxiety per her. Allergies are well-controlled on Singulair and zyrtec.  Symptoms are usually triggered by seasonal allergies, animal dander, grass, mildew and illness. For allergies, she takes with montelukast and zyrtec.  09/06/20 Since our last visit, she went to the beach in a condo in [place] for the Fourth. Last month she started on amoxicillin and steroids by her PCP. She completed a 10 day course of steroids. Two days ago, she attempted to self administer her Harrington Challenger however equipment was faulty and she did not receive the medication. She is currently on the 8 week rotation. She also not been taking her Dulera x 1 month due to severe thrush. She restarted Dulera this week. She is using Albuterol as needed and not needed it in the last week. Compliant with singulair. She has shortness of breath and wheezing but does not limit her activity  11/29/20 Reports asthma is well-controlled. ACT 25. Compliant with Dulera twice a day with spacer. Ginette Pitman has improved. On Fasenra and singulair. No  exacerbations since our last visit. Denies shortness of breath, cough or wheezing.  Asthma Control Test ACT Total Score  11/29/2020 25  09/06/2020 17  03/08/2020 23    Current asthma medications: Dulera 200-5 2 puffs twice daily Montelukast 10 mg daily Zyertec daily PRN albuterol  Prior medications: Symbicort - no improvement per patient  Steroid Hx:  2018- October XX 2019 Jan Feb Mar April May June July Aug Sept Oct Nov Dec   X       X X XXX    2020 Jan Feb Mar April May June July Aug Sept Oct Nov Dec                 2021 Jan Feb March April May June July Aug Sept Oct Nov Dec                2022 Jan Feb March April May June July Aug Sept Oct Nov Dec          X        Age of tobacco initiation: 30 years old  Age of tobacco cessation: 30 years old Average packs/day: 1/2 ppd  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      Benralizumab (FASENRA PEN) 30 MG/ML SOAJ Inject 1 mL (30 mg total) into the skin every 8 (eight) weeks. 1 mL 2   buPROPion (WELLBUTRIN XL) 150 MG 24 hr tablet Take 150 mg by mouth daily.      cetirizine (ZYRTEC) 10 MG tablet Take 10 mg by mouth daily.      mometasone-formoterol (DULERA) 200-5 MCG/ACT AERO USE 2 PUFFS TWICE DAILY 13 g 12   montelukast (SINGULAIR) 10 MG tablet Take 1 tablet (10 mg total) by mouth at bedtime. 60 tablet 3   paragard intrauterine copper IUD IUD by Intrauterine route.     Spacer/Aero-Holding Chambers DEVI 1 Device by Does not apply route as needed. 1 each 0   vitamin B-12 (CYANOCOBALAMIN) 500 MCG tablet Take by mouth.     albuterol (PROVENTIL HFA;VENTOLIN HFA) 108 (90 Base) MCG/ACT inhaler Inhale 2  puffs into the lungs every 6 (six) hours as needed for wheezing or shortness of breath. (Patient not taking: No sig reported) 1 Inhaler 1   escitalopram (LEXAPRO) 10 MG tablet Take 10 mg by mouth daily.     No facility-administered medications prior to visit.   Review of Systems  Constitutional:  Negative for chills, diaphoresis, fever, malaise/fatigue and weight loss.  HENT:  Negative for congestion.   Respiratory:  Positive for shortness of breath and wheezing. Negative for cough, hemoptysis and sputum production.   Cardiovascular:  Negative for chest pain, palpitations and leg swelling.    Objective:   Vitals:   11/29/20 1002  BP: 118/70  Pulse: 89  Temp: 98.6 F (37 C)  TempSrc: Oral  SpO2: 100%  Weight: 252 lb (114.3 kg)  Height: 5' 5.5" (1.664 m)   Physical Exam: General: Well-appearing, no acute distress HENT: [place], AT Eyes: EOMI, no scleral icterus Respiratory: Clear to auscultation bilaterally.  No crackles, wheezing or rales Cardiovascular: RRR, -M/R/G, no JVD Extremities:-Edema,-tenderness Neuro: AAO x4, CNII-XII grossly intact Psych: Normal mood, normal affect  Data Reviewed:  Chest imaging: CXR 01/27/17 - No acute abnormalities. No edema, effusion or infiltrate  PFT:  11/29/2017 Interpretation: On ICS inhaler. Mild obstructive defect with FEV1 102%.  No significant bronchodilator effect.  Normal lung volumes and DLCO.  Labs: CBC    Component Value Date/Time   WBC 10.9 (H) 11/17/2017 1006   RBC 4.30 11/17/2017 1006   HGB 13.9 11/17/2017 1006   HCT 40.2 11/17/2017 1006   PLT 360.0 11/17/2017 1006   MCV 93.4 11/17/2017 1006   MCH 31.0 02/06/2015 1054   MCHC 34.7 11/17/2017 1006   RDW 12.9 11/17/2017 1006   LYMPHSABS 2.3 11/17/2017 1006   MONOABS 0.6 11/17/2017 1006   EOSABS 0.6 11/17/2017 1006   BASOSABS 0.1 11/17/2017 1006   Absolute eos 11/17/17 - 600   Assessment & Plan:   30 year old female with history of childhood asthma and allergic  rhinitis who presents for follow-up for severe persistent asthma on Fasenra. In August 2022, missed Fasenra injection due to faulty device and was in exacerbation. She has been on [place] since 01/2018. We discussed in the future weaning/discontinuing biologic however given her recent trial off Fasenra, she would not be a candidate to discontinue at this time. Will reassess in 1-2 years after stable symptom control. We discussed bronchodilator management and asthma action plan.  Severe Persistent Asthma - well-controlled CONTINUE Dulera 200-5 mcg 2 puffs twice a day CONTINUE Albuterol every 4 hours as needed for shortness of breath or wheezing  CONTINUE Singulair 10 mg daily CONTINUE Zyrtec daily CONTINUE Fasenra injections as scheduled CONTINUE spacer to use   Asthma Action Plan Increase Dulera to 2 puffs three times a day for worsening shortness of breath, wheezing and cough. If you symptoms do not improve in 24-48 hours, please our office for evaluation and/or prednisone taper.  Immunization History  Administered Date(s) Administered   Influenza Split 10/17/2018   Influenza Whole 10/17/2018   Influenza,inj,Quad PF,6-35 Mos 01/05/2020   Influenza-Unspecified 01/05/2020   Td 01/28/2004   No orders of the defined types were placed in this encounter.  No orders of the defined types were placed in this encounter.  Return in about 6 months (around 05/29/2021).  I have spent a total time of 36-minutes on the day of the appointment reviewing prior documentation, coordinating care and discussing medical diagnosis and plan with the patient/family. Past medical history, allergies, medications were reviewed. Pertinent imaging, labs and tests included in this note have been reviewed and interpreted independently by me.  Mechele Collin, M.D.  [facility] Pulmonary/Critical Care Medicine 11/29/2020 10:28 AM

## 2021-02-20 ENCOUNTER — Encounter: Payer: Self-pay | Admitting: Pulmonary Disease

## 2021-02-20 ENCOUNTER — Other Ambulatory Visit: Payer: Self-pay | Admitting: Pharmacist

## 2021-02-20 DIAGNOSIS — J4551 Severe persistent asthma with (acute) exacerbation: Secondary | ICD-10-CM

## 2021-02-20 MED ORDER — FASENRA PEN 30 MG/ML ~~LOC~~ SOAJ: 30.0000 mg | SUBCUTANEOUS | 1 refills | Status: DC

## 2021-02-20 NOTE — Telephone Encounter (Signed)
Refill for Fasenra sent to [facility] today.  Last OV with Dr. Everardo All on 11/29/20 w plan to continue Harrington Challenger and f/u with provider in May 2023  Chesley Mires, PharmD, MPH, BCPS Clinical Pharmacist (Rheumatology and Pulmonology)

## 2021-04-11 ENCOUNTER — Other Ambulatory Visit: Payer: Self-pay | Admitting: Pulmonary Disease

## 2021-06-27 ENCOUNTER — Ambulatory Visit (INDEPENDENT_AMBULATORY_CARE_PROVIDER_SITE_OTHER): Payer: Medicaid Other | Admitting: Pulmonary Disease

## 2021-06-27 ENCOUNTER — Encounter: Payer: Self-pay | Admitting: Pulmonary Disease

## 2021-06-27 VITALS — BP 126/62 | HR 91 | Temp 98.2°F | Ht 65.5 in | Wt 251.8 lb

## 2021-06-27 DIAGNOSIS — J4550 Severe persistent asthma, uncomplicated: Secondary | ICD-10-CM | POA: Diagnosis not present

## 2021-06-27 MED ORDER — DULERA 200-5 MCG/ACT IN AERO: 2.0000 | INHALATION_SPRAY | Freq: Two times a day (BID) | RESPIRATORY_TRACT | 11 refills | Status: DC

## 2021-06-27 MED ORDER — MONTELUKAST SODIUM 10 MG PO TABS: 10.0000 mg | ORAL_TABLET | Freq: Every day | ORAL | 3 refills | Status: DC

## 2021-06-27 NOTE — Patient Instructions (Signed)
  Severe Persistent Asthma - well-controlled CONTINUE Dulera 200-5 mcg 2 puffs twice a day. REFILL CONTINUE Albuterol every 4 hours as needed for shortness of breath or wheezing CONTINUE Singulair 10 mg daily. REFILL CONTINUE Zyrtec daily CONTINUE Fasenra injections as scheduled CONTINUE spacer to use   Asthma Action Plan Increase Dulera to 2 puffs three times a day for worsening shortness of breath, wheezing and cough. If you symptoms do not improve in 24-48 hours, please our office for evaluation and/or prednisone taper.  Follow-up with me in 1 year

## 2021-06-27 NOTE — Progress Notes (Signed)
Synopsis: Referred in 10/2017 for severe persistent asthma  Subjective:   PATIENT ID: Alisha Harris GENDER: female DOB: [birth year], MRN: [id number]   HPI  Chief Complaint  Patient presents with   Follow-up    Pt states she has been doing good since last visit and denies any complaints.   [name] is a 31 year old female with history of childhood asthma, allergic rhinitis who presents for follow-up of severe persistent asthma. Started on [place] in 01/2018.  Synopsis: Alisha Harris 01/2018 with no exacerbation since enrollment. For allergies, she takes with montelukast and zyrtec. Symptoms are usually triggered by seasonal allergies, animal dander, grass, mildew and illness.  2022 - Treated for exacerbation after missing dose of Fasenra on vacation   06/27/21 Asthma is well-controlled. Denies wheezing, cough shortness of breath. No nocturnal symptoms. Rarely uses her albuterol. Compliant with Dulera with spacer and singulair. On Fasenra  Asthma Control Test ACT Total Score  06/27/2021  1:36 PM 24  11/29/2020 10:03 AM 25  09/06/2020  9:22 AM 17    Current asthma medications: Dulera 200-5 2 puffs twice daily Montelukast 10 mg daily Zyertec daily PRN albuterol  Prior medications: Symbicort - no improvement per patient  Steroid Hx:  2018- October XX 2019 Jan Feb Mar April May June July Aug Sept Oct Nov Dec   Alisha Harris XXX    2020 Jan Feb Mar April May June July Aug Sept Oct Nov Dec                 2021 Jan Feb March April May June July Aug Sept Oct Nov Dec                2022 Jan Feb March April May June July Aug Sept Oct Nov Dec          X        Age of tobacco initiation: 31 years old  Age of tobacco cessation: 31 years old Average packs/day: 1/2 ppd  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 1 Inhaler 1   amphetamine-dextroamphetamine (ADDERALL) 20 MG tablet Take 20 mg by mouth 2 (two) times daily.      Benralizumab (FASENRA PEN) 30 MG/ML SOAJ Inject 1 mL (30 mg total) into the skin every 8 (eight) weeks. 1 mL 1   buPROPion (WELLBUTRIN XL) 150 MG 24 hr tablet Take 150 mg by mouth daily.      cetirizine (ZYRTEC) 10 MG tablet Take 10 mg by mouth daily.      escitalopram (LEXAPRO) 10 MG tablet Take 10 mg by mouth daily.     paragard intrauterine copper IUD IUD by Intrauterine route.     Spacer/Aero-Holding Chambers DEVI 1 Device by Does not apply route as needed. 1 each 0   vitamin B-12 (CYANOCOBALAMIN) 500 MCG tablet Take by mouth.     DULERA 200-5 MCG/ACT AERO USE 2 PUFFS TWICE DAILY 13 g 12   montelukast (SINGULAIR) 10 MG tablet Take 1 tablet (10 mg total) by mouth  at bedtime. 60 tablet 3   No facility-administered medications prior to visit.   ROS   Objective:   Vitals:   06/27/21 1336  BP: 126/62  Pulse: 91  Temp: 98.2 F (36.8 C)  TempSrc: Oral  SpO2: 99%  Weight: 251 lb 12.8 oz (114.2 kg)  Height: 5' 5.5" (1.664 m)   Physical Exam: General: Well-appearing, no acute distress HENT: [place], AT Eyes: EOMI, no scleral icterus Respiratory: Clear to auscultation bilaterally.  No crackles, wheezing or rales Cardiovascular: RRR, -M/R/G, no JVD Extremities:-Edema,-tenderness Neuro: AAO x4, CNII-XII grossly intact Psych: Normal mood, normal affect  Data Reviewed:  Chest imaging: CXR 01/27/17 - No acute abnormalities. No edema, effusion or infiltrate  PFT:  11/29/2017 Interpretation: On ICS inhaler. Mild obstructive defect with FEV1 102%.  No significant bronchodilator effect.  Normal lung volumes and  DLCO.  Labs: CBC    Component Value Date/Time   WBC 10.9 (H) 11/17/2017 1006   RBC 4.30 11/17/2017 1006   HGB 13.9 11/17/2017 1006   HCT 40.2 11/17/2017 1006   PLT 360.0 11/17/2017 1006   MCV 93.4 11/17/2017 1006   MCH 31.0 02/06/2015 1054   MCHC 34.7 11/17/2017 1006   RDW 12.9 11/17/2017 1006   LYMPHSABS 2.3 11/17/2017 1006   MONOABS 0.6 11/17/2017 1006   EOSABS 0.6 11/17/2017 1006   BASOSABS 0.1 11/17/2017 1006   Absolute eos 11/17/17 - 600   Assessment & Plan:   31 year old female with history of childhood asthma and allergic rhinitis who presents for follow-up for severe persistent asthma on Fasenra.  Enrolled in [place] since 01/2018.  Had previously discussed weaning/discontinuing biologic however failed in 08/2020.  She would not be a candidate to discontinue at this time due to very recurrent symptoms.  We will reassess again in 1 to 2 years after stable symptom control. Discussed clinical course and management of asthma including bronchodilator regimen and action plan for exacerbation.  Severe Persistent Asthma - well-controlled CONTINUE Dulera 200-5 mcg 2 puffs twice a day. REFILL CONTINUE Albuterol every 4 hours as needed for shortness of breath or wheezing CONTINUE Singulair 10 mg daily. REFILL CONTINUE Zyrtec daily CONTINUE Fasenra injections as scheduled CONTINUE spacer to use   Asthma Action Plan Increase Dulera to 2 puffs three times a day for worsening shortness of breath, wheezing and cough. If you symptoms do not improve in 24-48 hours, please our office for evaluation and/or prednisone taper.  Immunization History  Administered Date(s) Administered   Influenza Split 10/17/2018   Influenza Whole 10/17/2018   Influenza,inj,Quad PF,6-35 Mos 01/05/2020   Influenza-Unspecified 01/05/2020   Td 01/28/2004   Meds ordered this encounter  Medications   mometasone-formoterol (DULERA) 200-5 MCG/ACT AERO    Sig: Inhale 2 puffs into the lungs in the morning and at  bedtime.    Dispense:  13 g    Refill:  11   montelukast (SINGULAIR) 10 MG tablet    Sig: Take 1 tablet (10 mg total) by mouth at bedtime.    Dispense:  90 tablet    Refill:  3   No orders of the defined types were placed in this encounter.  Return in about 1 year (around 06/28/2022).  I have spent a total time of 35-minutes on the day of the appointment reviewing prior documentation, coordinating care and discussing medical diagnosis and plan with the patient/family. Past medical history, allergies, medications were reviewed. Pertinent imaging, labs and tests included in this note have been reviewed and interpreted independently by me.  Mechele CollinJane Kairee Kozma, MD [facility] Pulmonary/Critical Care Medicine 06/27/2021 1:54 PM

## 2021-08-22 ENCOUNTER — Other Ambulatory Visit: Payer: Self-pay

## 2021-08-22 ENCOUNTER — Emergency Department (HOSPITAL_BASED_OUTPATIENT_CLINIC_OR_DEPARTMENT_OTHER)
Admission: EM | Admit: 2021-08-22 | Discharge: 2021-08-22 | Disposition: A | Discharge status: Home / Self Care | Payer: Medicaid Other | Attending: Emergency Medicine | Admitting: Emergency Medicine

## 2021-08-22 ENCOUNTER — Encounter (HOSPITAL_BASED_OUTPATIENT_CLINIC_OR_DEPARTMENT_OTHER): Payer: Self-pay | Admitting: Emergency Medicine

## 2021-08-22 ENCOUNTER — Emergency Department (HOSPITAL_BASED_OUTPATIENT_CLINIC_OR_DEPARTMENT_OTHER): Payer: Medicaid Other

## 2021-08-22 DIAGNOSIS — Z7951 Long term (current) use of inhaled steroids: Secondary | ICD-10-CM | POA: Insufficient documentation

## 2021-08-22 DIAGNOSIS — R1031 Right lower quadrant pain: Secondary | ICD-10-CM | POA: Insufficient documentation

## 2021-08-22 DIAGNOSIS — J45909 Unspecified asthma, uncomplicated: Secondary | ICD-10-CM | POA: Diagnosis not present

## 2021-08-22 DIAGNOSIS — R1084 Generalized abdominal pain: Secondary | ICD-10-CM

## 2021-08-22 LAB — COMPREHENSIVE METABOLIC PANEL
ALT: 15 U/L (ref 0–44)
AST: 15 U/L (ref 15–41)
Albumin: 4.4 g/dL (ref 3.5–5.0)
Alkaline Phosphatase: 43 U/L (ref 38–126)
Anion gap: 8 (ref 5–15)
BUN: 10 mg/dL (ref 6–20)
CO2: 29 mmol/L (ref 22–32)
Calcium: 9.3 mg/dL (ref 8.9–10.3)
Chloride: 102 mmol/L (ref 98–111)
Creatinine, Ser: 0.85 mg/dL (ref 0.44–1.00)
GFR, Estimated: >60 mL/min (ref >60)
Glucose, Bld: 103 mg/dL — ABNORMAL HIGH (ref 70–99)
Potassium: 3.7 mmol/L (ref 3.5–5.1)
Sodium: 139 mmol/L (ref 135–145)
Total Bilirubin: 0.4 mg/dL (ref 0.3–1.2)
Total Protein: 6.6 g/dL (ref 6.5–8.1)

## 2021-08-22 LAB — CBC
HCT: 38.3 % (ref 36.0–46.0)
Hemoglobin: 13 g/dL (ref 12.0–15.0)
MCH: 30.5 pg (ref 26.0–34.0)
MCHC: 33.9 g/dL (ref 30.0–36.0)
MCV: 89.9 fL (ref 80.0–100.0)
Platelets: 380 10*3/uL (ref 150–400)
RBC: 4.26 MIL/uL (ref 3.87–5.11)
RDW: 13.8 % (ref 11.5–15.5)
WBC: 5.7 10*3/uL (ref 4.0–10.5)
nRBC: 0 % (ref 0.0–0.2)

## 2021-08-22 LAB — URINALYSIS, ROUTINE W REFLEX MICROSCOPIC
Bilirubin Urine: NEGATIVE
Glucose, UA: NEGATIVE mg/dL
Hgb urine dipstick: NEGATIVE
Ketones, ur: NEGATIVE mg/dL
Nitrite: NEGATIVE
Specific Gravity, Urine: 1.027 (ref 1.005–1.030)
pH: 6 (ref 5.0–8.0)

## 2021-08-22 LAB — PREGNANCY, URINE: Preg Test, Ur: NEGATIVE

## 2021-08-22 LAB — LIPASE, BLOOD: Lipase: 10 U/L — ABNORMAL LOW (ref 11–51)

## 2021-08-22 MED ORDER — IOHEXOL 300 MG/ML  SOLN
100.0000 mL | Freq: Once | INTRAMUSCULAR | Status: AC | PRN
  Administered 2021-08-22: 100 mL via INTRAVENOUS

## 2021-08-22 MED ORDER — KETOROLAC TROMETHAMINE 30 MG/ML IJ SOLN
30.0000 mg | Freq: Once | INTRAMUSCULAR | Status: AC
  Administered 2021-08-22: 30 mg via INTRAVENOUS
  Filled 2021-08-22: qty 1

## 2021-08-22 NOTE — ED Provider Notes (Signed)
[facility] EMERGENCY DEPT Provider Note   CSN: [id number] Arrival date & time: 08/22/21  9798     History  Chief Complaint  Patient presents with   Abdominal Pain    Alisha Harris is a 31 y.o. female.  Abdominal pain for the last 12 hours.  Mostly now in the right lower quadrant.  Denies any pain urination.  No history of abdominal surgery.  Nothing makes it worse or better.  History of anxiety and asthma.  No nausea or vomiting.  Having normal bowel movements.  Has IUD.  Denies any bleeding, discharge, headache, numbness, weakness, chest pain, shortness of breath.  The history is provided by the patient.       Home Medications Prior to Admission medications   Medication Sig Start Date End Date Taking? Authorizing Provider  albuterol (PROVENTIL HFA;VENTOLIN HFA) 108 (90 Base) MCG/ACT inhaler Inhale 2 puffs into the lungs every 6 (six) hours as needed for wheezing or shortness of breath. 11/05/16   [place], Everardo All, FNP  amphetamine-dextroamphetamine (ADDERALL) 20 MG tablet Take 20 mg by mouth 2 (two) times daily.  11/20/16   [provider]  Benralizumab (FASENRA PEN) 30 MG/ML SOAJ Inject 1 mL (30 mg total) into the skin every 8 (eight) weeks. 02/20/21   Luciano Cutter, MD  buPROPion (WELLBUTRIN XL) 150 MG 24 hr tablet Take 150 mg by mouth daily.  08/26/16   [provider]  cetirizine (ZYRTEC) 10 MG tablet Take 10 mg by mouth daily.     [provider]  escitalopram (LEXAPRO) 10 MG tablet Take 10 mg by mouth daily. 11/22/20   [provider]  mometasone-formoterol (DULERA) 200-5 MCG/ACT AERO Inhale 2 puffs into the lungs in the morning and at bedtime. 06/27/21   Luciano Cutter, MD  montelukast (SINGULAIR) 10 MG tablet Take 1 tablet (10 mg total) by mouth at bedtime. 06/27/21   Luciano Cutter, MD  paragard intrauterine copper IUD IUD by Intrauterine route.    [provider]  Spacer/Aero-Holding Chambers DEVI 1  Device by Does not apply route as needed. 09/06/20   Luciano Cutter, MD  vitamin B-12 (CYANOCOBALAMIN) 500 MCG tablet Take by mouth.    [provider]      Allergies    Patient has no known allergies.    Review of Systems   Review of Systems  Physical Exam Updated Vital Signs BP 108/84   Pulse 78   Temp 98 F (36.7 C) (Oral)   Resp 14   Ht 5\' 5"  (1.651 m)   Wt 108.9 kg   SpO2 98%   BMI 39.94 kg/m  Physical Exam Vitals and nursing note reviewed.  Constitutional:      General: She is not in acute distress.    Appearance: She is well-developed. She is not ill-appearing.  HENT:     Head: Normocephalic and atraumatic.  Eyes:     Conjunctiva/sclera: Conjunctivae normal.  Cardiovascular:     Rate and Rhythm: Normal rate and regular rhythm.     Heart sounds: Normal heart sounds. No murmur heard. Pulmonary:     Effort: Pulmonary effort is normal. No respiratory distress.     Breath sounds: Normal breath sounds.  Abdominal:     Palpations: Abdomen is soft.     Tenderness: There is abdominal tenderness in the right lower quadrant and suprapubic area.  Musculoskeletal:        General: No swelling.     Cervical back: Neck  supple.  Skin:    General: Skin is warm and dry.     Capillary Refill: Capillary refill takes less than 2 seconds.  Neurological:     Mental Status: She is alert.  Psychiatric:        Mood and Affect: Mood normal.     ED Results / Procedures / Treatments   Labs (all labs ordered are listed, but only abnormal results are displayed) Labs Reviewed  LIPASE, BLOOD - Abnormal; Notable for the following components:      Result Value   Lipase 10 (*)    All other components within normal limits  COMPREHENSIVE METABOLIC PANEL - Abnormal; Notable for the following components:   Glucose, Bld 103 (*)    All other components within normal limits  URINALYSIS, ROUTINE W REFLEX MICROSCOPIC - Abnormal; Notable for the following components:   Protein, ur  TRACE (*)    Leukocytes,Ua SMALL (*)    Bacteria, UA FEW (*)    All other components within normal limits  CBC  PREGNANCY, URINE    EKG None  Radiology CT ABDOMEN PELVIS W CONTRAST  Result Date: 08/22/2021 CLINICAL DATA:  31 year old female with history of right lower quadrant abdominal pain and nausea. EXAM: CT ABDOMEN AND PELVIS WITH CONTRAST TECHNIQUE: Multidetector CT imaging of the abdomen and pelvis was performed using the standard protocol following bolus administration of intravenous contrast. RADIATION DOSE REDUCTION: This exam was performed according to the departmental dose-optimization program which includes automated exposure control, adjustment of the mA and/or kV according to patient size and/or use of iterative reconstruction technique. CONTRAST:  OMNIPAQUE IOHEXOL 300 MG/ML  SOLN COMPARISON:  None Available. FINDINGS: Lower chest: Unremarkable. Hepatobiliary: No suspicious cystic or solid hepatic lesions. No intra or extrahepatic biliary ductal dilatation. Gallbladder is normal in appearance. Pancreas: No pancreatic mass. No pancreatic ductal dilatation. No pancreatic or peripancreatic fluid collections or inflammatory changes. Spleen: Unremarkable. Adrenals/Urinary Tract: Bilateral kidneys and adrenal glands are normal in appearance. No hydroureteronephrosis. Urinary bladder is normal in appearance. Stomach/Bowel: The appearance of the stomach is normal. There is no pathologic dilatation of small bowel or colon. A few scattered colonic diverticulae are noted, without surrounding inflammatory changes to indicate an acute diverticulitis at this time. Normal appendix. Vascular/Lymphatic: No significant atherosclerotic disease, aneurysm or dissection noted in the abdominal or pelvic vasculature. No lymphadenopathy noted in the abdomen or pelvis. Reproductive: IUD noted in the uterus. Uterus and ovaries are otherwise unremarkable in appearance. Other: No significant volume of  ascites.  No pneumoperitoneum. Musculoskeletal: There are no aggressive appearing lytic or blastic lesions noted in the visualized portions of the skeleton. IMPRESSION: 1. No acute findings are noted in the abdomen or pelvis to account for the patient's symptoms. Specifically, the appendix is normal. 2. Mild chronic diverticulosis without evidence of acute diverticulitis at this time. Electronically Signed   By: Trudie Reed M.D.   On: 08/22/2021 08:41    Procedures Procedures    Medications Ordered in ED Medications  ketorolac (TORADOL) 30 MG/ML injection 30 mg (has no administration in time range)  iohexol (OMNIPAQUE) 300 MG/ML solution 100 mL (100 mLs Intravenous Contrast Given 08/22/21 0807)    ED Course/ Medical Decision Making/ A&P                           Medical Decision Making Amount and/or Complexity of Data Reviewed Labs: ordered. Radiology: ordered.  Risk Prescription drug management.   Doy Mince  Babin is here with lower abdominal pain.  Normal vitals.  No fever.  Differential diagnosis is appendicitis versus less likely kidney stone, ovarian process, GI related illness.  We will get CBC, CMP, lipase, urinalysis, urine pregnancy test, CT scan abdomen and pelvis.  Per my review and interpretation of labs is no significant anemia, electrolyte abnormality, kidney injury, leukocytosis.  Pregnancy test negative.  Urinalysis negative for infection.  Awaiting CT scan abdomen pelvis.  Per my review and interpretation the labs is no significant anemia, electrolyte abnormality, kidney injury, leukocytosis.  Urinalysis negative for infection.  Pregnancy test is negative.  CT scan shows no evidence of appendicitis or bowel obstruction or kidney stone.  Overall unremarkable imaging.  Discharged in good condition.  This chart was dictated using voice recognition software.  Despite best efforts to proofread,  errors can occur which can change the documentation meaning.          Final Clinical Impression(s) / ED Diagnoses Final diagnoses:  Generalized abdominal pain    Rx / DC Orders ED Discharge Orders     None         Virgina Norfolk, DO 08/22/21 8144

## 2021-08-22 NOTE — ED Triage Notes (Signed)
  Patient comes in with abdominal pain that starts below her umbilicus and radiates to RLQ.  Patient states the pain started last night around 2230.  Patient had some nausea but no vomiting.  Pain 10/10, sharp/stabbing pain in RLQ.

## 2021-10-02 ENCOUNTER — Encounter: Payer: Self-pay | Admitting: Pulmonary Disease

## 2021-10-03 ENCOUNTER — Other Ambulatory Visit (HOSPITAL_COMMUNITY): Payer: Self-pay

## 2021-10-03 ENCOUNTER — Telehealth: Payer: Self-pay | Admitting: Pharmacist

## 2021-10-03 NOTE — Telephone Encounter (Signed)
Received a fax regarding Prior Authorization from [facility] MEDICAID for [facility]. Authorization has been DENIED because must have :lung function test (prebronchodilator FEV below 80% in adults and 90% in adolescents)..  Case ID:  [id number]  Will need to appeal  Chesley Mires, PharmD, MPH, BCPS, CPP Clinical Pharmacist (Rheumatology and Pulmonology)

## 2021-10-03 NOTE — Telephone Encounter (Signed)
Patient currently enrolled in [place] pt assistance for Fasenra. However patient now has [place] Medicaid.  Submitted an URGENT prior Authorization request to [facility] MEDICAID for [facility] via CoverMyMeds. Will update once we receive a response.  Key: [id number]  Chesley Mires, PharmD, MPH, BCPS, CPP Clinical Pharmacist (Rheumatology and Pulmonology)

## 2021-10-15 NOTE — Telephone Encounter (Signed)
Submitted an URGENT appeal to [place] for [facility]  Reference # [id number] Phone: [phone] Fax: [phone]  Knox Saliva, PharmD, MPH, BCPS, CPP Clinical Pharmacist (Rheumatology and Pulmonology)

## 2021-11-12 NOTE — Telephone Encounter (Signed)
Spoke with [facility] Medicaid and the appeal was denied on October 17, 2021.

## 2021-11-26 ENCOUNTER — Encounter (HOSPITAL_BASED_OUTPATIENT_CLINIC_OR_DEPARTMENT_OTHER): Payer: Self-pay | Admitting: Pulmonary Disease

## 2021-11-27 NOTE — Telephone Encounter (Signed)
Alisha Harris,  Patient is not able to receive [name] due to insurance. Do we have any Fasenra samples to carry her over since she is due this month? If not, then we may need to switch to another agent like Nucala or Dupixent.  Can we arrange a peer-to-peer or is denial final?  Rodman Pickle, M.D. [facility] Pulmonary/Critical Care Medicine 11/27/2021 11:26 AM

## 2021-12-04 ENCOUNTER — Encounter (HOSPITAL_BASED_OUTPATIENT_CLINIC_OR_DEPARTMENT_OTHER): Payer: Self-pay | Admitting: Pulmonary Disease

## 2021-12-04 NOTE — Telephone Encounter (Signed)
Patient picking up sample of Fasenra on 12/05/21  Spoke with patient regarding State Fair Hearing that will be next step  Chesley Mires, PharmD, MPH, BCPS, CPP Clinical Pharmacist (Rheumatology and Pulmonology)

## 2021-12-04 NOTE — Telephone Encounter (Signed)
Any updates? 

## 2021-12-04 NOTE — Telephone Encounter (Signed)
Patient sent message stating she will no longer be able to come tomorrow to pick up Fasenra pen sample. She will stop by on 12/12/21 instead.  Will still need to sign form for Dr. Everardo All to move forward with State Hearing  Chesley Mires, PharmD, MPH, BCPS, CPP Clinical Pharmacist (Rheumatology and Pulmonology)

## 2021-12-04 NOTE — Telephone Encounter (Signed)
Per [facility] Medicaid appeal determination letter. On 10/03/21 you or your provider asked [place] to approved your request for a health service. We finished your appeal on 10/17/21. We have decided not to change our original decision. Appeal was reviewed by physician board certified in Pulmonary Medicine  To appeal this decision, you must ask for a State Fair Hearing.   Case ID: [id number]  Patient is stopping by tomorrow, 12/05/21 to pick up Fasenra sample since she is off of work. She will need to sign [employer] form. Once completed, will be faxed. Then [employer] [place] will call within 5 business days to request a [employer] which is voluntary.  Appeal determination has been sent to media tab of patinet's chart.  Chesley Mires, PharmD, MPH, BCPS, CPP Clinical Pharmacist (Rheumatology and Pulmonology)

## 2021-12-05 NOTE — Telephone Encounter (Signed)
Will await hearing date.

## 2021-12-15 NOTE — Telephone Encounter (Signed)
-  Signed appeal form received with patient's signature. Faxed to  [facility] for state fair hearing. Also Cc'd Dr. Everardo All with State Fair Hearing documents  Fax: [phone]  Patient picked up Fasenra sample on 12/12/21.  Chesley Mires, PharmD, MPH, BCPS, CPP Clinical Pharmacist (Rheumatology and Pulmonology)-

## 2021-12-16 NOTE — Telephone Encounter (Signed)
Received notification form Chesly, admin associated with Jonathon Resides. Patient is approved for expedited hearing with Jonathon Resides at 1pm on 12/17/21.  Provided Chesly with best contact number for Dr. Everardo All. He said email was sent for call-in number for Dr. Everardo All to reach [place].  Chesley Mires, PharmD, MPH, BCPS, CPP Clinical Pharmacist (Rheumatology and Pulmonology)

## 2021-12-17 ENCOUNTER — Telehealth: Payer: Self-pay | Admitting: Pulmonary Disease

## 2021-12-17 ENCOUNTER — Encounter (HOSPITAL_BASED_OUTPATIENT_CLINIC_OR_DEPARTMENT_OTHER): Payer: Self-pay

## 2021-12-17 DIAGNOSIS — J4550 Severe persistent asthma, uncomplicated: Secondary | ICD-10-CM

## 2021-12-17 NOTE — Telephone Encounter (Signed)
Will email Dupixent MyWay forms to [facility]  Chesley Mires, PharmD, MPH, BCPS, CPP Clinical Pharmacist (Rheumatology and Pulmonology)

## 2021-12-17 NOTE — Telephone Encounter (Signed)
State Fair Hearing held over telephone today.  Harrington Challenger denied due to PFTs (FVC and/or FEV1 >80%). Request to repeat PFTs from patient's insurance.  Please start enrollment for Dupixent which insurance reports that can perform an expedited prior auth <24 hours once submitted.  Patient is available December 1st for PFT testing and to sign Dupixent enrollment forms.  Plan - Please schedule patient for PFTs on December 1st at [facility]

## 2021-12-17 NOTE — Telephone Encounter (Signed)
Patient needs repeat PFTs and CBC w diff. ATC patient/ Unable to reach and VM box is full.  Chesley Mires, PharmD, MPH, BCPS, CPP Clinical Pharmacist (Rheumatology and Pulmonology)

## 2021-12-17 NOTE — Telephone Encounter (Signed)
Spoke with Dr. Everardo All about hearing from today. Patient continues to be denied [place]. She wil need repeat PFT and repeat CBC w diff to assess eosinophils  Will email Dupixent MyWay form to [employer] for patient to complete  Dr. Everardo All has f/u call with Jonathon Resides on 12/31/21  Chesley Mires, PharmD, MPH, BCPS, CPP Clinical Pharmacist (Rheumatology and Pulmonology)

## 2021-12-22 ENCOUNTER — Encounter (HOSPITAL_BASED_OUTPATIENT_CLINIC_OR_DEPARTMENT_OTHER): Payer: Self-pay | Admitting: Pulmonary Disease

## 2021-12-22 NOTE — Telephone Encounter (Signed)
Dupixent MyWay form for patient provided to [facility] today  Chesley Mires, PharmD, MPH, BCPS, CPP Clinical Pharmacist (Rheumatology and Pulmonology)

## 2021-12-29 ENCOUNTER — Other Ambulatory Visit (HOSPITAL_BASED_OUTPATIENT_CLINIC_OR_DEPARTMENT_OTHER): Payer: Self-pay

## 2021-12-29 DIAGNOSIS — J4550 Severe persistent asthma, uncomplicated: Secondary | ICD-10-CM

## 2021-12-30 ENCOUNTER — Other Ambulatory Visit (HOSPITAL_BASED_OUTPATIENT_CLINIC_OR_DEPARTMENT_OTHER): Payer: Self-pay

## 2021-12-30 ENCOUNTER — Ambulatory Visit (INDEPENDENT_AMBULATORY_CARE_PROVIDER_SITE_OTHER): Payer: Medicaid Other | Admitting: Pulmonary Disease

## 2021-12-30 DIAGNOSIS — J4550 Severe persistent asthma, uncomplicated: Secondary | ICD-10-CM

## 2021-12-30 DIAGNOSIS — J4551 Severe persistent asthma with (acute) exacerbation: Secondary | ICD-10-CM

## 2021-12-30 LAB — PULMONARY FUNCTION TEST
DL/VA % pred: 127 %
DL/VA: 5.74 ml/min/mmHg/L
DLCO cor % pred: 113 %
DLCO cor: 26.29 ml/min/mmHg
DLCO unc % pred: 113 %
DLCO unc: 26.29 ml/min/mmHg
FEF 25-75 Post: 4.18 L/sec
FEF 25-75 Pre: 2.99 L/sec
FEF2575-%Change-Post: 39 %
FEF2575-%Pred-Post: 122 %
FEF2575-%Pred-Pre: 87 %
FEV1-%Change-Post: 9 %
FEV1-%Pred-Post: 98 %
FEV1-%Pred-Pre: 89 %
FEV1-Post: 3.2 L
FEV1-Pre: 2.91 L
FEV1FVC-%Change-Post: 3 %
FEV1FVC-%Pred-Pre: 97 %
FEV6-%Change-Post: 5 %
FEV6-%Pred-Post: 98 %
FEV6-%Pred-Pre: 92 %
FEV6-Post: 3.77 L
FEV6-Pre: 3.56 L
FEV6FVC-%Pred-Post: 101 %
FEV6FVC-%Pred-Pre: 101 %
FVC-%Change-Post: 5 %
FVC-%Pred-Post: 97 %
FVC-%Pred-Pre: 91 %
FVC-Post: 3.77 L
FVC-Pre: 3.56 L
Post FEV1/FVC ratio: 85 %
Post FEV6/FVC ratio: 100 %
Pre FEV1/FVC ratio: 82 %
Pre FEV6/FVC Ratio: 100 %
RV % pred: 95 %
RV: 1.43 L
TLC % pred: 93 %
TLC: 4.84 L

## 2021-12-30 LAB — CBC WITH DIFFERENTIAL
Basophils Absolute: 0 10*3/uL (ref 0.0–0.2)
Basos: 0 %
EOS (ABSOLUTE): 0 10*3/uL (ref 0.0–0.4)
Eos: 0 %
Hematocrit: 37.7 % (ref 34.0–46.6)
Hemoglobin: 12.3 g/dL (ref 11.1–15.9)
Immature Grans (Abs): 0 10*3/uL (ref 0.0–0.1)
Immature Granulocytes: 0 %
Lymphocytes Absolute: 1.8 10*3/uL (ref 0.7–3.1)
Lymphs: 33 %
MCH: 29.9 pg (ref 26.6–33.0)
MCHC: 32.6 g/dL (ref 31.5–35.7)
MCV: 92 fL (ref 79–97)
Monocytes Absolute: 0.4 10*3/uL (ref 0.1–0.9)
Monocytes: 7 %
Neutrophils Absolute: 3.3 10*3/uL (ref 1.4–7.0)
Neutrophils: 60 %
RBC: 4.12 x10E6/uL (ref 3.77–5.28)
RDW: 12.6 % (ref 11.7–15.4)
WBC: 5.5 10*3/uL (ref 3.4–10.8)

## 2021-12-30 NOTE — Patient Instructions (Signed)
Full PFT Performed Today  

## 2021-12-30 NOTE — Telephone Encounter (Signed)
Pharm team, can you please see pt's email regarding facsenra and advise her what she is to do? Thanks!

## 2021-12-30 NOTE — Progress Notes (Signed)
Full PFT Performed Today  

## 2021-12-31 NOTE — Telephone Encounter (Signed)
Signed DMW form for pt received. There is another hearing with Medicaid tomorrow afternoon. Will await determination from this before running Dupixent BIV  Chesley Mires, PharmD, MPH, BCPS, CPP Clinical Pharmacist (Rheumatology and Pulmonology)

## 2022-01-01 ENCOUNTER — Telehealth: Payer: Self-pay | Admitting: Pharmacist

## 2022-01-01 ENCOUNTER — Telehealth (HOSPITAL_BASED_OUTPATIENT_CLINIC_OR_DEPARTMENT_OTHER): Payer: Self-pay | Admitting: Pulmonary Disease

## 2022-01-01 DIAGNOSIS — J4550 Severe persistent asthma, uncomplicated: Secondary | ICD-10-CM

## 2022-01-01 NOTE — Telephone Encounter (Addendum)
Received call from Dr. Everardo All. Patient's Harrington Challenger continues to remain denied. Will move forward with Dupixent authorization.  Submitted an EXPEDITED Prior Authorization request to  [facility]  for DUPIXENT via CoverMyMeds. Will update once we receive a response.  Key: [id number] [place] Case ID: [id number]  Called patient and provided her with update (Fasenra denial and plan to move forward with Dupixent). She verbalized understanding  Chesley Mires, PharmD, MPH, BCPS, CPP Clinical Pharmacist (Rheumatology and Pulmonology)

## 2022-01-01 NOTE — Telephone Encounter (Signed)
State Fair Hearing Mediation Meeting:   -Discussed patient's case with judge and insurance reps present -Repeat PFTs did not meet state criteria for [name] -Discussed criteria for transitioning from [place] to [place] and [employer] (Dr. Lyndel Safe and pharmacy team rep) who stated that patient's request for [place] would be reviewed under criteria for Continued Therapy of [place].  >"For beneficiaries already receiving Dupixent [ any biologic per discussion during mediation], coverage is provided when criteria for initial therapy are met and there is continued clinical benefit as evidence by reductions in asthma exacerbations from baseline supported by medical records documenting documenting the beneficiary's current asthma status and response to Dupixent [ any biologic per discussion during mediation]"  Patient's baseline asthma exacerbations documented in clinic note by me (Dr. Loanne Drilling) on 06/27/21. In 2019 had 6 exacerbations due to her severe persistent asthma with persistent systems on steroids. Labs demonstrated peripheral eosinophilia 11/17/17 of 600 pre-treatment.  After initiation of Fasenra in 01/2018, she had no exacerbations in 2020 or 2021. Trial off of [name] was discussed however she experienced severe exacerbation in 08/2020 after missing Fasenra dose. Per clinic note on  09/06/20 she was treated for exacerbation requiring steroids.  Patient had to pick up [place] sample from our office on 12/05/21 for reemerging asthma symptoms not controlled on ICS/LABA.  After Mediation meeting, I have contacted our pharmacy team. Will request for enrollment with [place] today. Insurance has stated they will review within 24 hours.  Rodman Pickle, M.D. [facility] Pulmonary/Critical Care Medicine 01/01/2022 1:18 PM

## 2022-01-02 ENCOUNTER — Other Ambulatory Visit (HOSPITAL_COMMUNITY): Payer: Self-pay

## 2022-01-02 MED ORDER — DUPIXENT 300 MG/2ML ~~LOC~~ SOAJ
SUBCUTANEOUS | 0 refills | Status: DC
  Filled 2022-01-02: qty 8, 28d supply, fill #0

## 2022-01-02 NOTE — Telephone Encounter (Signed)
Discussed with Emi Belfast, PharmD regarding early start. OK to initiate Dupixent.

## 2022-01-02 NOTE — Telephone Encounter (Signed)
Received notification from [facility] MEDICAID regarding a prior authorization for DUPIXENT. Authorization has been APPROVED from 01/01/2022 to 01/02/2023. Approval letter sent to scan center.  Per test claim, copay for 28 days supply is $4  Patient can fill through [facility]: [phone]   Authorization # [id number]  Her last dose of Harrington Challenger was on 12/12/2021. Next dose would be due 02/09/2022. Routing to Dr. Everardo All for recs on when pt can start.  Chesley Mires, PharmD, MPH, BCPS, CPP Clinical Pharmacist (Rheumatology and Pulmonology)

## 2022-01-02 NOTE — Telephone Encounter (Addendum)
Per Dr. Everardo All, okay to start patient sooner on Dupixent. Left VM for patient.  Rx sent to [facility] in anticipation of new start on or after 01/12/22 (tbd)  Chesley Mires, PharmD, MPH, BCPS, CPP Clinical Pharmacist (Rheumatology and Pulmonology)

## 2022-01-06 ENCOUNTER — Other Ambulatory Visit: Payer: Self-pay

## 2022-01-07 ENCOUNTER — Other Ambulatory Visit: Payer: Self-pay

## 2022-01-07 ENCOUNTER — Other Ambulatory Visit (HOSPITAL_COMMUNITY): Payer: Self-pay

## 2022-01-08 ENCOUNTER — Other Ambulatory Visit (HOSPITAL_COMMUNITY): Payer: Self-pay

## 2022-01-09 NOTE — Telephone Encounter (Signed)
Patient returned call. She states she'll have to speak to her manager about coordinating an appt for next week. She will call back on Monday, 01/12/2022  Chesley Mires, PharmD, MPH, BCPS, CPP Clinical Pharmacist (Rheumatology and Pulmonology)

## 2022-01-09 NOTE — Telephone Encounter (Signed)
Dupixent received from [facility]. In [place].  ATC patient to schedule Dupixent new start. She states she will call back after 12pm since she is at work right now.  Chesley Mires, PharmD, MPH, BCPS, CPP Clinical Pharmacist (Rheumatology and Pulmonology)

## 2022-01-12 ENCOUNTER — Ambulatory Visit: Payer: Medicaid Other | Admitting: Pharmacist

## 2022-01-12 ENCOUNTER — Other Ambulatory Visit (HOSPITAL_COMMUNITY): Payer: Self-pay

## 2022-01-12 DIAGNOSIS — J4550 Severe persistent asthma, uncomplicated: Secondary | ICD-10-CM

## 2022-01-12 DIAGNOSIS — Z7189 Other specified counseling: Secondary | ICD-10-CM

## 2022-01-12 MED ORDER — DUPIXENT 300 MG/2ML ~~LOC~~ SOAJ
300.0000 mg | SUBCUTANEOUS | 1 refills | Status: DC
  Filled 2022-01-12: qty 12, 84d supply, fill #0
  Filled 2022-02-06: qty 4, 28d supply, fill #0
  Filled 2022-03-03: qty 4, 28d supply, fill #1
  Filled 2022-03-27: qty 4, 28d supply, fill #2
  Filled 2022-04-22: qty 4, 28d supply, fill #3
  Filled 2022-05-22: qty 4, 28d supply, fill #4
  Filled 2022-06-19: qty 4, 28d supply, fill #5

## 2022-01-12 NOTE — Telephone Encounter (Signed)
Patient scheduled for Dupixent new start today, 01/12/2022  Chesley Mires, PharmD, MPH, BCPS, CPP Clinical Pharmacist (Rheumatology and Pulmonology)

## 2022-01-12 NOTE — Progress Notes (Signed)
HPI Patient presents today to [facility] to see pharmacy team for Dupixent new start for severe persistent asthma.  Past medical history includes allergic rhinitis. She reports history of atopic dermatitis but I do not see that  Her last dose of Harrington Challenger was on 12/12/2021. She is transitioning due to coverage issues with Medicaid and failure to get it re-approved despite significant clinical response and disease stability with Harrington Challenger on board. After discussion with Dr. Everardo All, patient can start Dupixent sooner than January 2024.   Respiratory Medications Current regimen: Dulera 200-5 mcg (2 puffs twice daily), montelukast 10mg  nightly Tried in past: - denied due to not meet PFT criteria (despite multiple appeal attempts from pharmacy team and Dr. Harrington Challenger) Patient reports no known adherence challenges  OBJECTIVE No Known Allergies  Outpatient Encounter Medications as of 01/12/2022  Medication Sig   albuterol (PROVENTIL HFA;VENTOLIN HFA) 108 (90 Base) MCG/ACT inhaler Inhale 2 puffs into the lungs every 6 (six) hours as needed for wheezing or shortness of breath.   amphetamine-dextroamphetamine (ADDERALL) 20 MG tablet Take 20 mg by mouth 2 (two) times daily.    buPROPion (WELLBUTRIN XL) 150 MG 24 hr tablet Take 150 mg by mouth daily.    cetirizine (ZYRTEC) 10 MG tablet Take 10 mg by mouth daily.    Dupilumab (DUPIXENT) 300 MG/2ML SOPN Inject 600mg  into the skin at Week 0 then 300mg  every 14 days thereafter. Courier to pulm: [address], [place] [address] by 01/12/22   escitalopram (LEXAPRO) 10 MG tablet Take 10 mg by mouth daily.   mometasone-formoterol (DULERA) 200-5 MCG/ACT AERO Inhale 2 puffs into the lungs in the morning and at bedtime.   montelukast (SINGULAIR) 10 MG tablet Take 1 tablet (10 mg total) by mouth at bedtime.   paragard intrauterine copper IUD IUD by Intrauterine route.   Spacer/Aero-Holding Chambers DEVI 1 Device by Does not apply route as  needed.   vitamin B-12 (CYANOCOBALAMIN) 500 MCG tablet Take by mouth.   No facility-administered encounter medications on file as of 01/12/2022.     Immunization History  Administered Date(s) Administered   Influenza Split 10/17/2018   Influenza Whole 10/17/2018   Influenza,inj,Quad PF,6-35 Mos 01/05/2020   Influenza-Unspecified 01/05/2020   Td 01/28/2004     PFTs    Latest Ref Rng & Units 12/30/2021    8:33 AM 11/29/2017   10:28 AM  PFT Results  FVC-Pre L 3.56  4.11   FVC-Predicted Pre % 91  103   FVC-Post L 3.77  4.07   FVC-Predicted Post % 97  102   Pre FEV1/FVC % % 82  80   Post FEV1/FCV % % 85  84   FEV1-Pre L 2.91  3.30   FEV1-Predicted Pre % 89  98   FEV1-Post L 3.20  3.43   DLCO uncorrected ml/min/mmHg 26.29  30.04   DLCO UNC% % 113  114   DLCO corrected ml/min/mmHg 26.29  29.59   DLCO COR %Predicted % 113  112   DLVA Predicted % 127  115   TLC L 4.84  5.16   TLC % Predicted % 93  97   RV % Predicted % 95  74      Eosinophils Most recent blood eosinophil count was <50 cells/microL taken on 12/30/2021 - (likely due to [place] use)  IgE: 85 on 11/17/2017   Assessment   Biologics training for dupilumab (Dupixent)  Goals of therapy: Mechanism: human monoclonal IgG4 antibody that inhibits interleukin-4  and interleukin-13 cytokine-induced responses, including release of proinflammatory cytokines, chemokines, and IgE Reviewed that Dupixent is add-on medication and patient must continue maintenance inhaler regimen. Response to therapy: may take 4 months to determine efficacy. Discussed that patients generally feel improvement sooner than 4 months.  Side effects: injection site reaction (6-18%), antibody development (5-16%), ophthalmic conjunctivitis (2-16%), transient blood eosinophilia (1-2%)  Dose: 600mg  at Week 0 (administered today in clinic) followed by 300mg  every 14 days thereafter  Administration/Storage:  Reviewed administration sites of thigh or  abdomen (at least 2-3 inches away from abdomen). Reviewed the upper arm is only appropriate if caregiver is administering injection  Do not shake pen/syringe as this could lead to product foaming or precipitation. Do not use if solution is discolored or contains particulate matter or if window on prefilled pen is yellow (indicates pen has been used).  Reviewed storage of medication in refrigerator. Reviewed that Dupixent can be stored at room temperature in unopened carton for up to 14 days.  Access: Approval of Dupixent through: insurance  Patient self-administered Dupixent 300mg /65ml x 2 (total dose 600mg ) in left lower abdomen and provider administered in left upper arm using [facility]-supplied medication: Dupixent 300mg /15mL autoinjector pen NDC: [phone] Lot: [id number] Expiration: 10/26/2023  Patient monitored for 30 minutes for adverse reaction.  Patient tolerated without issue. Injection site checked and no redness or swelling noted. Patient denies itchiness or irritation at injection site.  Medication Reconciliation  A drug regimen assessment was performed, including review of allergies, interactions, disease-state management, dosing and immunization history. Medications were reviewed with the patient, including name, instructions, indication, goals of therapy, potential side effects, importance of adherence, and safe use.  Drug interaction(s): none noted  PLAN Continue Dupixent 300mg  every 14 days.  Next dose is due 01/26/2022 and every 14 days thereafter. Rx sent to: [facility]: [phone] .  Patient provided with pharmacy phone number and advised that they will call her to schedule shipment to home.  Continue maintenance asthma regimen of: Dulera 200-5 mcg (2 puffs twice daily), montelukast 10mg  nightly  All questions encouraged and answered.  Instructed patient to reach out with any further questions or concerns.  Thank you for allowing pharmacy to  participate in this patient's care.  This appointment required 50 minutes of patient care (this includes precharting, chart review, review of results, face-to-face care, etc.).  1f, PharmD, MPH, BCPS, CPP Clinical Pharmacist (Rheumatology and Pulmonology)

## 2022-01-12 NOTE — Patient Instructions (Addendum)
Your next DUPIXENT dose is due on 01/26/2022, 02/09/2022, and every 14 days thereafter  CONTINUE  Dulera 200-5 mcg (2 puffs twice daily), montelukast 10mg  nightly  Your prescription will be shipped from [facility]. Their phone number is [phone] They will call to schedule shipment and confirm address. They will mail your medication to your home.  You will need to be seen by your provider in 3 to 4 months to assess how DUPIXENT is working for you. Please ensure you have a follow-up appointment scheduled in [facility] OR APRIL 2024. Call our clinic if you need to make this appointment.  How to manage an injection site reaction: Remember the 5 C's: COUNTER - leave on the counter at least 30 minutes but up to overnight to bring medication to room temperature. This may help prevent stinging COLD - place something cold (like an ice gel pack or cold water bottle) on the injection site just before cleansing with alcohol. This may help reduce pain CLARITIN - use Claritin (generic name is loratadine) for the first two weeks of treatment or the day of, the day before, and the day after injecting. This will help to minimize injection site reactions CORTISONE CREAM - apply if injection site is irritated and itching CALL ME - if injection site reaction is bigger than the size of your fist, looks infected, blisters, or if you develop hives

## 2022-02-06 ENCOUNTER — Other Ambulatory Visit (HOSPITAL_COMMUNITY): Payer: Self-pay

## 2022-02-09 ENCOUNTER — Other Ambulatory Visit (HOSPITAL_COMMUNITY): Payer: Self-pay

## 2022-02-24 ENCOUNTER — Other Ambulatory Visit (HOSPITAL_COMMUNITY): Payer: Self-pay

## 2022-02-26 ENCOUNTER — Other Ambulatory Visit: Payer: Self-pay

## 2022-03-03 ENCOUNTER — Other Ambulatory Visit (HOSPITAL_COMMUNITY): Payer: Self-pay

## 2022-03-04 ENCOUNTER — Other Ambulatory Visit: Payer: Self-pay

## 2022-03-26 ENCOUNTER — Other Ambulatory Visit (HOSPITAL_COMMUNITY): Payer: Self-pay

## 2022-03-27 ENCOUNTER — Other Ambulatory Visit (HOSPITAL_COMMUNITY): Payer: Self-pay

## 2022-03-30 ENCOUNTER — Other Ambulatory Visit: Payer: Self-pay

## 2022-04-10 ENCOUNTER — Ambulatory Visit: Payer: Medicaid Other | Admitting: Primary Care

## 2022-04-10 NOTE — Progress Notes (Deleted)
@Patient  ID: Alisha Harris, female    DOB: January 01, 1991, 32 y.o.   MRN: [id number]  No chief complaint on file.   Referring provider: Aletha Halim., PA-C  HPI: [name] is a 32 year old female with history of childhood asthma, allergic rhinitis who presents for follow-up of severe persistent asthma.  Started Fasenra 01/2018 with no exacerbation since enrollment. For allergies, she takes with montelukast and zyrtec. Symptoms are usually triggered by seasonal allergies, animal dander, grass, mildew and illness.  2022 - Treated for exacerbation after missing dose of Fasenra on vacation  Previous [facility] pulmonary encounter: 06/27/21 Asthma is well-controlled. Denies wheezing, cough shortness of breath. No nocturnal symptoms. Rarely uses her albuterol. Compliant with Dulera with spacer and singulair. On Fasenra  04/10/2022 - Interim hx  Patient presents today for acute visit. Follows with our office for severe persistent asthma. Current regimen Dulera 262mcg, prn Albuterol, Singulair 10 mg daily, Zyrtec. She was previously on [place] and asthma symptoms were well-controlled, however, medication was denied due to not meeting PFT criteria despite multiple appeal attempts by pharmacy and Dr. Loanne Drilling. Patient has been on Dupixent 300mg  q 14 days since December 2023.          No Known Allergies  Immunization History  Administered Date(s) Administered   Influenza Split 10/17/2018   Influenza Whole 10/17/2018   Influenza,inj,Quad PF,6-35 Mos 01/05/2020   Influenza-Unspecified 01/05/2020   Td 01/28/2004    Past Medical History:  Diagnosis Date   Anxiety    Asthma     Tobacco History: Social History   Tobacco Use  Smoking Status Former   Packs/day: 0.05   Years: 2.00   Additional pack years: 0.00   Total pack years: 0.10   Types: Cigarettes   Start date: 2006   Quit date: 2008   Years since quitting: 16.2  Smokeless Tobacco Never  Tobacco Comments   did it  here and there. stopped around    Counseling given: Not Answered Tobacco comments: did it here and there. stopped around    Outpatient Medications Prior to Visit  Medication Sig Dispense Refill   albuterol (PROVENTIL HFA;VENTOLIN HFA) 108 (90 Base) MCG/ACT inhaler Inhale 2 puffs into the lungs every 6 (six) hours as needed for wheezing or shortness of breath. 1 Inhaler 1   amphetamine-dextroamphetamine (ADDERALL) 20 MG tablet Take 20 mg by mouth 2 (two) times daily.      buPROPion (WELLBUTRIN XL) 150 MG 24 hr tablet Take 150 mg by mouth daily.      cetirizine (ZYRTEC) 10 MG tablet Take 10 mg by mouth daily.      Dupilumab (DUPIXENT) 300 MG/2ML SOPN Inject 300 mg into the skin every 14 (fourteen) days. 12 mL 1   escitalopram (LEXAPRO) 10 MG tablet Take 10 mg by mouth daily.     mometasone-formoterol (DULERA) 200-5 MCG/ACT AERO Inhale 2 puffs into the lungs in the morning and at bedtime. 13 g 11   montelukast (SINGULAIR) 10 MG tablet Take 1 tablet (10 mg total) by mouth at bedtime. 90 tablet 3   paragard intrauterine copper IUD IUD by Intrauterine route.     Spacer/Aero-Holding Chambers DEVI 1 Device by Does not apply route as needed. 1 each 0   vitamin B-12 (CYANOCOBALAMIN) 500 MCG tablet Take by mouth.     No facility-administered medications prior to visit.      Review of Systems  Review of Systems   Physical Exam  There were no vitals taken  for this visit. Physical Exam   Lab Results:  CBC    Component Value Date/Time   WBC 5.5 12/30/2021 1040   WBC 5.7 08/22/2021 0635   RBC 4.12 12/30/2021 1040   RBC 4.26 08/22/2021 0635   HGB 12.3 12/30/2021 1040   HCT 37.7 12/30/2021 1040   PLT 380 08/22/2021 0635   MCV 92 12/30/2021 1040   MCH 29.9 12/30/2021 1040   MCH 30.5 08/22/2021 0635   MCHC 32.6 12/30/2021 1040   MCHC 33.9 08/22/2021 0635   RDW 12.6 12/30/2021 1040   LYMPHSABS 1.8 12/30/2021 1040   MONOABS 0.6 11/17/2017 1006   EOSABS 0.0 12/30/2021 1040   BASOSABS  0.0 12/30/2021 1040    BMET    Component Value Date/Time   NA 139 08/22/2021 0635   K 3.7 08/22/2021 0635   CL 102 08/22/2021 0635   CO2 29 08/22/2021 0635   GLUCOSE 103 (H) 08/22/2021 0635   BUN 10 08/22/2021 0635   CREATININE 0.85 08/22/2021 0635   CALCIUM 9.3 08/22/2021 0635   GFRNONAA >60 08/22/2021 0635   GFRAA >60 02/06/2015 1054    BNP No results found for: "BNP"  ProBNP No results found for: "PROBNP"  Imaging: No results found.   Assessment & Plan:   No problem-specific Assessment & Plan notes found for this encounter.     Martyn Ehrich, NP 04/10/2022

## 2022-04-22 ENCOUNTER — Other Ambulatory Visit (HOSPITAL_COMMUNITY): Payer: Self-pay

## 2022-04-27 ENCOUNTER — Other Ambulatory Visit: Payer: Self-pay

## 2022-05-15 ENCOUNTER — Ambulatory Visit (INDEPENDENT_AMBULATORY_CARE_PROVIDER_SITE_OTHER): Payer: Medicaid Other | Admitting: Pulmonary Disease

## 2022-05-15 ENCOUNTER — Encounter (HOSPITAL_BASED_OUTPATIENT_CLINIC_OR_DEPARTMENT_OTHER): Payer: Self-pay | Admitting: Pulmonary Disease

## 2022-05-15 VITALS — BP 140/80 | HR 87 | Ht 65.5 in | Wt 276.6 lb

## 2022-05-15 DIAGNOSIS — J4550 Severe persistent asthma, uncomplicated: Secondary | ICD-10-CM | POA: Diagnosis not present

## 2022-05-15 MED ORDER — MONTELUKAST SODIUM 10 MG PO TABS: 10.0000 mg | ORAL_TABLET | Freq: Every day | ORAL | 3 refills | Status: DC

## 2022-05-15 MED ORDER — DULERA 200-5 MCG/ACT IN AERO
2.0000 | INHALATION_SPRAY | Freq: Two times a day (BID) | RESPIRATORY_TRACT | 11 refills | Status: DC
Start: 2022-05-15 — End: 2023-03-15

## 2022-05-15 NOTE — Patient Instructions (Signed)
Severe Persistent Asthma - well-controlled Decrease Dulera 200-5 mcg 1 puffs twice a day. CONTINUE Albuterol every 4 hours as needed for shortness of breath or wheezing CONTINUE Singulair 10 mg daily. REFILL CONTINUE Zyrtec daily CONTINUE Dupixent injections as scheduled CONTINUE spacer to use

## 2022-05-15 NOTE — Progress Notes (Signed)
Synopsis: Referred in 10/2017 for severe persistent asthma  Subjective:   PATIENT ID: Alisha Harris GENDER: female DOB: Dec 27, 1990, MRN: [id number]   HPI  Chief Complaint  Patient presents with   Follow-up    Doesn't feel as good on Dupixent    [name] is a 32 year old female with history of childhood asthma, allergic rhinitis who presents for follow-up of severe persistent asthma. Started on [place] in 01/2018.  Synopsis: Alisha Harris 01/2018 with no exacerbation since enrollment. For allergies, she takes with montelukast and zyrtec. Symptoms are usually triggered by seasonal allergies, animal dander, grass, mildew and illness.  2022 - Treated for exacerbation after missing dose of Fasenra on vacation 2023 - Well controlled on Fasenra. Had to swtich to Dupixent due to insurance denial in November. Started on December.  05/15/22 She reports nasal congestion leading to cough, fatigue in mid-March. Improved without antibiotics. Denies shortness of breath, cough or wheezing. No nocturnal symptoms. Rarely uses albuterol. Although her symptoms seem controlled, she does not feel as well as compared to Alisha Harris.  Asthma Control Test ACT Total Score  06/27/2021  1:36 PM 24  11/29/2020 10:03 AM 25  09/06/2020  9:22 AM 17   Current asthma medications: Dulera 200-5 2 puffs twice daily Montelukast 10 mg daily Zyertec daily PRN albuterol  Prior medications: Symbicort - no improvement per patient  Steroid Hx:  2018- October XX 2019 Jan Feb Mar April May June July Aug Sept Oct Nov Dec   Alisha Harris XXX    2020 Jan Feb Mar April May June July Aug Sept Oct Nov Dec                 2021 Jan Feb March April May June July Aug Sept Oct Nov Dec                2022 Jan Feb March April May June July Aug Sept Oct Nov Dec          X       2023 Jan Feb Mar April May June July Aug Sept Oct Nov Dec                2024 Jan Feb Mar April May June July Aug Sept Oct Nov Dec                 2025 Jan Feb Mar April May June July Aug Sept Oct Nov Dec                 Age of tobacco initiation: 32 years old  Age of tobacco cessation: 32 years old Average packs/day: 1/2 ppd  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 1 Inhaler 1   amphetamine-dextroamphetamine (ADDERALL) 20 MG tablet Take 20 mg by mouth 2 (two) times daily.      buPROPion (WELLBUTRIN XL) 150 MG 24 hr tablet Take 150 mg by mouth daily.      cetirizine (ZYRTEC) 10 MG tablet Take 10 mg by mouth daily.  Dupilumab (DUPIXENT) 300 MG/2ML SOPN Inject 300 mg into the skin every 14 (fourteen) days. 12 mL 1   escitalopram (LEXAPRO) 10 MG tablet Take 10 mg by mouth daily.     paragard intrauterine copper IUD IUD by Intrauterine route.     Spacer/Aero-Holding Chambers DEVI 1 Device by Does not apply route as needed. 1 each 0   vitamin B-12 (CYANOCOBALAMIN) 500 MCG tablet Take by mouth.     mometasone-formoterol (DULERA) 200-5 MCG/ACT AERO Inhale 2 puffs into the lungs in the morning and at bedtime. 13 g 11   montelukast (SINGULAIR) 10 MG tablet Take 1 tablet (10 mg total) by mouth at bedtime. 90 tablet 3   No facility-administered medications prior to visit.   Review of Systems  Constitutional:  Negative for chills, diaphoresis, fever, malaise/fatigue and weight loss.  HENT:  Positive for congestion.   Respiratory:  Negative for cough, hemoptysis, sputum production, shortness of breath and wheezing.   Cardiovascular:  Negative for chest pain, palpitations and leg swelling.     Objective:   Vitals:    05/15/22 1519  BP: (!) 140/80  Pulse: 87  SpO2: 99%  Weight: 276 lb 9.6 oz (125.5 kg)  Height: 5' 5.5" (1.664 m)   Physical Exam: General: Well-appearing, no acute distress HENT: Alisha Harris, AT Eyes: EOMI, no scleral icterus Respiratory: Clear to auscultation bilaterally.  No crackles, wheezing or rales Cardiovascular: RRR, -M/R/G, no JVD Extremities:-Edema,-tenderness Neuro: AAO x4, CNII-XII grossly intact Psych: Normal mood, normal affect   Data Reviewed:  Chest imaging: CXR 01/27/17 - No acute abnormalities. No edema, effusion or infiltrate  PFT:  11/29/2017 Interpretation: On ICS inhaler. Mild obstructive defect with FEV1 102%.  No significant bronchodilator effect.  Normal lung volumes and DLCO.  12/30/21 FVC 3.77 (97%) FEV1 3.20 (98%) Ratio 85  TLC 93% DLCO 113%. Partial bronchodilator response however not significant. This does not preclude inhaler if benefit bronchodilator perceived Interpretation: Normal spirometry on ICS/LABA   Labs: CBC    Component Value Date/Time   WBC 5.5 12/30/2021 1040   WBC 5.7 08/22/2021 0635   RBC 4.12 12/30/2021 1040   RBC 4.26 08/22/2021 0635   HGB 12.3 12/30/2021 1040   HCT 37.7 12/30/2021 1040   PLT 380 08/22/2021 0635   MCV 92 12/30/2021 1040   MCH 29.9 12/30/2021 1040   MCH 30.5 08/22/2021 0635   MCHC 32.6 12/30/2021 1040   MCHC 33.9 08/22/2021 0635   RDW 12.6 12/30/2021 1040   LYMPHSABS 1.8 12/30/2021 1040   MONOABS 0.6 11/17/2017 1006   EOSABS 0.0 12/30/2021 1040   BASOSABS 0.0 12/30/2021 1040   Absolute eos 11/17/17 - 600 Absolute eos 12/30/21 - 0   Assessment & Plan:   32 year old female with hx of childhood asthma and allergic rhinitis who presents for follow-up for severe persistent asthma. Discussed clinical course and management of asthma including bronchodilator regimen, preventive care including vaccinations and action plan for exacerbation.  Previously on Fasenra 01/2018-12/2021. Failed weaning in 08/2020 with  exacerbation On Dupixent 12/2021  Severe Persistent Asthma - well-controlled Decrease Dulera 200-5 mcg 1 puffs twice a day. CONTINUE Albuterol every 4 hours as needed for shortness of breath or wheezing CONTINUE Singulair 10 mg daily. REFILL CONTINUE Zyrtec daily CONTINUE Dupixent injections as scheduled CONTINUE spacer to use   Asthma Action Plan Increase Dulera to 2 puffs three times a day for worsening shortness of breath, wheezing and cough. If you symptoms do not improve in 24-48 hours, please our office  for evaluation and/or prednisone taper.  Immunization History  Administered Date(s) Administered   Influenza Split 10/17/2018   Influenza Whole 10/17/2018   Influenza,inj,Quad PF,6-35 Mos 01/05/2020   Influenza-Unspecified 01/05/2020   Td 01/28/2004   Meds ordered this encounter  Medications   mometasone-formoterol (DULERA) 200-5 MCG/ACT AERO    Sig: Inhale 2 puffs into the lungs in the morning and at bedtime.    Dispense:  13 g    Refill:  11   montelukast (SINGULAIR) 10 MG tablet    Sig: Take 1 tablet (10 mg total) by mouth at bedtime.    Dispense:  90 tablet    Refill:  3   No orders of the defined types were placed in this encounter.  Return in about 6 months (around 11/14/2022).  I have spent a total time of 35-minutes on the day of the appointment including chart review, data review, collecting history, coordinating care and discussing medical diagnosis and plan with the patient/family. Past medical history, allergies, medications were reviewed. Pertinent imaging, labs and tests included in this note have been reviewed and interpreted independently by me.  Mechele Collin, MD [facility] Pulmonary/Critical Care Medicine 05/15/2022 3:39 PM

## 2022-05-22 ENCOUNTER — Other Ambulatory Visit (HOSPITAL_COMMUNITY): Payer: Self-pay

## 2022-05-26 ENCOUNTER — Other Ambulatory Visit (HOSPITAL_COMMUNITY): Payer: Self-pay

## 2022-06-19 ENCOUNTER — Other Ambulatory Visit (HOSPITAL_COMMUNITY): Payer: Self-pay

## 2022-06-24 ENCOUNTER — Other Ambulatory Visit (HOSPITAL_COMMUNITY): Payer: Self-pay

## 2022-07-13 ENCOUNTER — Other Ambulatory Visit: Payer: Self-pay | Admitting: Pulmonary Disease

## 2022-07-13 ENCOUNTER — Other Ambulatory Visit: Payer: Self-pay

## 2022-07-13 ENCOUNTER — Other Ambulatory Visit (HOSPITAL_COMMUNITY): Payer: Self-pay

## 2022-07-13 DIAGNOSIS — J4550 Severe persistent asthma, uncomplicated: Secondary | ICD-10-CM

## 2022-07-13 MED ORDER — DUPIXENT 300 MG/2ML ~~LOC~~ SOAJ
300.0000 mg | SUBCUTANEOUS | 1 refills | Status: DC
  Filled 2022-07-13 – 2022-08-18 (×2): qty 4, 28d supply, fill #0
  Filled 2022-09-09 – 2022-09-14 (×2): qty 4, 28d supply, fill #1
  Filled 2022-10-05: qty 4, 28d supply, fill #2
  Filled 2022-11-06: qty 4, 28d supply, fill #3
  Filled ????-??-??: qty 4, 28d supply, fill #0

## 2022-07-15 ENCOUNTER — Other Ambulatory Visit (HOSPITAL_COMMUNITY): Payer: Self-pay

## 2022-07-17 ENCOUNTER — Other Ambulatory Visit (HOSPITAL_COMMUNITY): Payer: Self-pay

## 2022-07-20 ENCOUNTER — Other Ambulatory Visit (HOSPITAL_COMMUNITY): Payer: Self-pay

## 2022-07-22 ENCOUNTER — Other Ambulatory Visit (HOSPITAL_COMMUNITY): Payer: Self-pay

## 2022-07-22 ENCOUNTER — Telehealth: Payer: Self-pay

## 2022-07-22 NOTE — Telephone Encounter (Signed)
Received notification from [facility] that pt's insurance is showing terminated as of 06/26/2022. Per message, pt seemed like she was expecting it however it may have happened sooner than she was expecting. At this time I am unclear of the circumstances and have left the pt a VM with my direct call back number to discuss.  Will await f/u.

## 2022-07-23 NOTE — Telephone Encounter (Signed)
Pt returned call and explained that she has completed enrollment for her new insurance plan through [employer] and is awaiting a determination. Pt was initially concerned because she was informed by her doctor that, because she had switched insurances, she would need to completely "start over". I clarified that we would only need to start over from a BIV perspective, not that she would require another New Start visit to be reloaded on the medication--to which she expressed relief.   Pt recalled that she had previously sent her insurance info to [place] via MyChart and requested to do so again, a message has been sent to her to ensure an open line of communication through the portal. Pt stated that her next injection would be due on 7/1, and when asked she informs me that she does not have another injection for that dose. I discussed that she can come to the clinic this afternoon or tomorrow to pick up a sample, she will come tomorrow on 6/28 between 9 and 12.

## 2022-08-07 ENCOUNTER — Other Ambulatory Visit (HOSPITAL_COMMUNITY): Payer: Self-pay

## 2022-08-07 NOTE — Telephone Encounter (Signed)
Attempted to submit a Prior Authorization request to [facility] for DUPIXENT via CoverMyMeds, however request was canceled stating that no new PA is required. Received fax stating the same, sent to scan center for retention.  Key: [id number] PA Case ID #: [id number]  Per test claim results:   Will work on enrolling pt into copay card program. Attempted once through portal, will allow time to process and run test claim. If it still has not been activated then I will call and manually enroll pt.

## 2022-08-07 NOTE — Telephone Encounter (Signed)
Copay card is still not processing, will work on alternate means of enrollment.

## 2022-08-17 ENCOUNTER — Other Ambulatory Visit (HOSPITAL_COMMUNITY): Payer: Self-pay

## 2022-08-18 ENCOUNTER — Other Ambulatory Visit (HOSPITAL_COMMUNITY): Payer: Self-pay

## 2022-08-18 NOTE — Telephone Encounter (Signed)
Dupixent copay card per patient's MyChart message: BIN: [id number] PCN: [id number] Group: [id number] ID: [id number]  Chesley Mires, PharmD, MPH, BCPS, CPP Clinical Pharmacist (Rheumatology and Pulmonology)

## 2022-09-09 ENCOUNTER — Other Ambulatory Visit: Payer: Self-pay

## 2022-09-09 ENCOUNTER — Other Ambulatory Visit (HOSPITAL_COMMUNITY): Payer: Self-pay

## 2022-09-14 ENCOUNTER — Other Ambulatory Visit: Payer: Self-pay

## 2022-09-14 ENCOUNTER — Other Ambulatory Visit (HOSPITAL_COMMUNITY): Payer: Self-pay

## 2022-10-05 ENCOUNTER — Other Ambulatory Visit (HOSPITAL_COMMUNITY): Payer: Self-pay

## 2022-10-09 ENCOUNTER — Encounter (HOSPITAL_BASED_OUTPATIENT_CLINIC_OR_DEPARTMENT_OTHER): Payer: Self-pay | Admitting: Pulmonary Disease

## 2022-10-09 ENCOUNTER — Telehealth: Payer: Self-pay | Admitting: Pulmonary Disease

## 2022-10-09 NOTE — Telephone Encounter (Signed)
PT wanted to make appt for FU w/Dr. Everardo All. None avail until Dec. She said she would reach out to her the the Portal. NFN.

## 2022-10-13 ENCOUNTER — Other Ambulatory Visit: Payer: Self-pay

## 2022-11-06 ENCOUNTER — Other Ambulatory Visit: Payer: Self-pay

## 2022-11-06 NOTE — Progress Notes (Signed)
Specialty Pharmacy Refill Coordination Note  Alisha Harris is a 32 y.o. female contacted today regarding refills of specialty medication(s) Dupilumab   Patient requested Delivery   Delivery date: 11/13/22   Verified address: [address] [place], [address]   Medication will be filled on 11/12/22.

## 2022-11-12 ENCOUNTER — Other Ambulatory Visit: Payer: Self-pay

## 2022-11-13 ENCOUNTER — Other Ambulatory Visit: Payer: Self-pay

## 2022-11-13 ENCOUNTER — Other Ambulatory Visit (HOSPITAL_COMMUNITY): Payer: Self-pay

## 2022-11-13 ENCOUNTER — Encounter (HOSPITAL_BASED_OUTPATIENT_CLINIC_OR_DEPARTMENT_OTHER): Payer: Self-pay | Admitting: Pulmonary Disease

## 2022-11-18 ENCOUNTER — Telehealth: Payer: Self-pay | Admitting: Pharmacist

## 2022-11-18 NOTE — Telephone Encounter (Signed)
Patient picked up 2 Dupixent boxes of samples - 300mg /56mL  #4 pens Directions: inject 300mg  into the skin every 14 days  She will check into insurance coverage. She stated today that she did not receive any notification that pharmacy was not running through the insurance at all. She states she is not sure why but she will check with insurance regarding this. I advised this may be a deductible issue because insurance may not be picking up copay until a certain deductible is met which is being charged onto copay card at this pt. She is aware that if she opts into the same plan for next year, then she will run out of copay card funds even sooner next year so it is imperative the correct insurance plan is picked. She verbalized understanding  Chesley Mires, PharmD, MPH, BCPS, CPP Clinical Pharmacist (Rheumatology and Pulmonology)

## 2022-11-25 ENCOUNTER — Other Ambulatory Visit: Payer: Self-pay

## 2022-11-25 NOTE — Progress Notes (Signed)
Rx place on hold; dupixent cc not working, provided pt phone number for dupixent my way program Per Shavod note  On 11/18/22 Note from [facility] patient got 2 Boxes of sample (4 pens)  from Md office.

## 2022-11-26 ENCOUNTER — Other Ambulatory Visit: Payer: Self-pay

## 2022-11-26 ENCOUNTER — Other Ambulatory Visit (HOSPITAL_COMMUNITY): Payer: Self-pay

## 2022-12-03 ENCOUNTER — Other Ambulatory Visit: Payer: Self-pay

## 2022-12-11 ENCOUNTER — Telehealth: Payer: Self-pay | Admitting: Pulmonary Disease

## 2022-12-11 ENCOUNTER — Encounter (HOSPITAL_BASED_OUTPATIENT_CLINIC_OR_DEPARTMENT_OTHER): Payer: Self-pay | Admitting: Pulmonary Disease

## 2022-12-11 NOTE — Telephone Encounter (Signed)
Pt wants to know will it be okay to push her appt back to Jan until she gets better insurance Pt takes dupixent shots and wants to know if she can hold off for the moment

## 2022-12-13 NOTE — Telephone Encounter (Signed)
Ok to reschedule appointment for January if available

## 2022-12-14 NOTE — Telephone Encounter (Signed)
LVMTCB to reschedule appointment

## 2022-12-15 ENCOUNTER — Other Ambulatory Visit (HOSPITAL_COMMUNITY): Payer: Self-pay

## 2022-12-21 ENCOUNTER — Other Ambulatory Visit (HOSPITAL_COMMUNITY): Payer: Self-pay

## 2022-12-23 ENCOUNTER — Other Ambulatory Visit: Payer: Self-pay

## 2022-12-23 ENCOUNTER — Other Ambulatory Visit (HOSPITAL_COMMUNITY): Payer: Self-pay

## 2022-12-23 ENCOUNTER — Encounter (HOSPITAL_COMMUNITY): Payer: Self-pay

## 2022-12-23 NOTE — Progress Notes (Signed)
Specialty Pharmacy Refill Coordination Note  Alisha Harris is a 32 y.o. female contacted today regarding refills of specialty medication(s) Dupilumab   Patient requested Delivery   Delivery date: 02/01/23   Verified address: [address] [place], [place] [address]   Medication will be filled on TBD.  Patient requested a delivery date that is not an option and would result in multiple missed dose. LVM for patient regarding this issue.

## 2022-12-25 ENCOUNTER — Other Ambulatory Visit (HOSPITAL_COMMUNITY): Payer: Self-pay

## 2022-12-25 ENCOUNTER — Other Ambulatory Visit: Payer: Self-pay

## 2022-12-25 NOTE — Progress Notes (Signed)
Prior outreach note stated patient's dupixent funds for the year were exhausted and will not renew until January 2025. Office has provided patient with samples. Will time refill call to follow-up with patient at later date.

## 2023-01-08 ENCOUNTER — Other Ambulatory Visit: Payer: Self-pay

## 2023-01-08 ENCOUNTER — Telehealth: Payer: Self-pay

## 2023-01-08 NOTE — Telephone Encounter (Signed)
Pt came to clinic to pick up sample of Dupixent. She discussed with me that she has canceled her current insurance and she plans to look into potentially getting a better plan that suits all of her needs. She also asked about the possibility of receiving Dupixent without insurance, I discussed with her that we could enroll her into the Dupixent MyWay program as an uninsured pt in the meantime. She expressed interest in proceeding with this and verbally provided consent for [place] to sign and submit the paperwork on her behalf.   Will draft up the paperwork in preparation for submission.

## 2023-01-15 ENCOUNTER — Ambulatory Visit (HOSPITAL_BASED_OUTPATIENT_CLINIC_OR_DEPARTMENT_OTHER): Payer: Medicaid Other | Admitting: Pulmonary Disease

## 2023-01-25 ENCOUNTER — Other Ambulatory Visit (HOSPITAL_COMMUNITY): Payer: Self-pay

## 2023-01-31 ENCOUNTER — Encounter (HOSPITAL_BASED_OUTPATIENT_CLINIC_OR_DEPARTMENT_OTHER): Payer: Self-pay | Admitting: Pulmonary Disease

## 2023-01-31 NOTE — Telephone Encounter (Signed)
 Can you contact patient regarding status of coverage of her injections and address question with co-pay card?

## 2023-02-01 ENCOUNTER — Other Ambulatory Visit: Payer: Self-pay

## 2023-02-01 NOTE — Telephone Encounter (Signed)
 Submitted Patient Assistance Application to Dupixent MyWay for DUPIXENT along with provider portion, patient portion, medication list. Will update patient when we receive a response.  Phone #: [phone] Fax #: [phone]

## 2023-02-03 ENCOUNTER — Other Ambulatory Visit (HOSPITAL_COMMUNITY): Payer: Self-pay

## 2023-02-19 ENCOUNTER — Other Ambulatory Visit: Payer: Self-pay | Admitting: Pharmacist

## 2023-02-19 NOTE — Progress Notes (Signed)
Patient has lost insurance nad we are applying for [employer] PAP. Dis-enroling from CR  Chesley Mires, PharmD, MPH, BCPS, CPP Clinical Pharmacist (Rheumatology and Pulmonology)

## 2023-02-22 NOTE — Telephone Encounter (Signed)
Called [employer] for update on patient's PAP application. Per rep, benefits investigation is still in process  Alisha Harris, PharmD, MPH, BCPS, CPP Clinical Pharmacist (Rheumatology and Pulmonology)

## 2023-02-26 ENCOUNTER — Encounter (HOSPITAL_BASED_OUTPATIENT_CLINIC_OR_DEPARTMENT_OTHER): Payer: Self-pay | Admitting: Pulmonary Disease

## 2023-03-04 NOTE — Telephone Encounter (Signed)
 Spoke with [employer] rep today - she states that they are still waiting to finish last phases of patient assistance program enrollment. Rep cannot process while on phone. But will receive determination within  24-48 business hours   Sherry Pennant, PharmD, MPH, BCPS, CPP Clinical Pharmacist (Rheumatology and Pulmonology)

## 2023-03-05 ENCOUNTER — Telehealth: Payer: Self-pay | Admitting: Pulmonary Disease

## 2023-03-05 NOTE — Telephone Encounter (Signed)
 Patient checking on message for Dupixent . Patient phone number is [phone].

## 2023-03-08 ENCOUNTER — Encounter (HOSPITAL_BASED_OUTPATIENT_CLINIC_OR_DEPARTMENT_OTHER): Payer: Self-pay | Admitting: Pulmonary Disease

## 2023-03-08 NOTE — Telephone Encounter (Signed)
 Patient will have fiance pick up one pen sample of Dupixent  today. Patient advised that we can only provide one pen as we are low on samples  Geraldene Kleine, PharmD, MPH, BCPS, CPP Clinical Pharmacist (Rheumatology and Pulmonology)

## 2023-03-12 ENCOUNTER — Encounter (HOSPITAL_BASED_OUTPATIENT_CLINIC_OR_DEPARTMENT_OTHER): Payer: Self-pay | Admitting: Pulmonary Disease

## 2023-03-15 ENCOUNTER — Encounter (HOSPITAL_BASED_OUTPATIENT_CLINIC_OR_DEPARTMENT_OTHER): Payer: Self-pay | Admitting: Pulmonary Disease

## 2023-03-15 ENCOUNTER — Ambulatory Visit (INDEPENDENT_AMBULATORY_CARE_PROVIDER_SITE_OTHER): Payer: Self-pay | Admitting: Pulmonary Disease

## 2023-03-15 VITALS — BP 122/78 | HR 83 | Ht 65.5 in | Wt 253.1 lb

## 2023-03-15 DIAGNOSIS — J4550 Severe persistent asthma, uncomplicated: Secondary | ICD-10-CM

## 2023-03-15 DIAGNOSIS — J028 Acute pharyngitis due to other specified organisms: Secondary | ICD-10-CM

## 2023-03-15 DIAGNOSIS — B9689 Other specified bacterial agents as the cause of diseases classified elsewhere: Secondary | ICD-10-CM

## 2023-03-15 MED ORDER — FLUTICASONE-SALMETEROL 250-50 MCG/ACT IN AEPB: 1.0000 | INHALATION_SPRAY | Freq: Two times a day (BID) | RESPIRATORY_TRACT | 5 refills | Status: DC

## 2023-03-15 MED ORDER — ALBUTEROL SULFATE HFA 108 (90 BASE) MCG/ACT IN AERS: 2.0000 | INHALATION_SPRAY | Freq: Four times a day (QID) | RESPIRATORY_TRACT | 5 refills | Status: AC | PRN

## 2023-03-15 NOTE — Progress Notes (Unsigned)
 Synopsis: Referred in 10/2017 for severe persistent asthma  Subjective:   PATIENT ID: Alisha Harris GENDER: female DOB: [birth year], MRN: [id number]   HPI  Chief Complaint  Patient presents with   Follow-up    Asthma   [name] is a 33 year old female with history of childhood asthma, allergic rhinitis who presents for follow-up of severe persistent asthma. Started on [place] in 01/2018. Switched to Dupixent in 12/2021.  Synopsis: Alisha Harris 01/2018 with no exacerbation since enrollment. For allergies, she takes with montelukast and zyrtec. Symptoms are usually triggered by seasonal allergies, animal dander, grass, mildew and illness.  2022 - Treated for exacerbation after missing dose of Fasenra on vacation 2023 - Well controlled on Fasenra. Had to swtich to Dupixent due to insurance denial in November. Started on December. 2024 - Controlled on Dupixent but not as well controlled compared to [facility]. Faced difficulty with insurance/lack of insurance in obtaining  03/15/23 Since our last visit she is overall doing well when she is on her Dupixent. She will notice symptoms recur 2-3 days before her next dose with fatigue and shortness of breath. Access to medication is the main barrier.  Asthma Control Test ACT Total Score  03/15/2023  9:45 AM 24  06/27/2021  1:36 PM 24  11/29/2020 10:03 AM 25   Current asthma medications: Dulera 200-5 2 puffs twice daily Montelukast 10 mg daily Zyertec daily PRN albuterol  Prior medications: Symbicort - no improvement per patient  Steroid Hx:  2018- October XX 2019 Jan Feb Mar April May June July Aug Sept Oct Nov Dec   Alisha Harris XXX    2020 Jan Feb Mar April May June July Aug Sept Oct Nov Dec                 2021 Jan Feb March April May June July Aug Sept Oct Nov Dec                2022 Jan Feb March April May June July Aug Sept Oct Nov Dec          X       2023 Jan Feb Mar April May June July Aug Sept Oct Nov  Dec                2024 Jan Feb Mar April May June July Aug Sept Oct Nov Dec                2025 Jan Feb Mar April May June July Aug Sept Oct Nov Dec                 Age of tobacco initiation: 33 years old  Age of tobacco cessation: 33 years old Average packs/day: 1/2 ppd  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 1 Inhaler 1   amphetamine-dextroamphetamine (ADDERALL) 20 MG tablet Take 20 mg by mouth 2 (two) times daily.      cetirizine (ZYRTEC) 10 MG tablet Take 10 mg by mouth daily.  Dupilumab (DUPIXENT) 300 MG/2ML SOPN Inject 300 mg into the skin every 14 (fourteen) days. 12 mL 1   mometasone-formoterol (DULERA) 200-5 MCG/ACT AERO Inhale 2 puffs into the lungs in the morning and at bedtime. 13 g 11   montelukast (SINGULAIR) 10 MG tablet Take 1 tablet (10 mg total) by mouth at bedtime. 90 tablet 3   paragard intrauterine copper IUD IUD by Intrauterine route.     paragard intrauterine copper IUD IUD by Intrauterine route.     Spacer/Aero-Holding Chambers DEVI 1 Device by Does not apply route as needed. 1 each 0   vitamin B-12 (CYANOCOBALAMIN) 500 MCG tablet Take by mouth.     buPROPion (WELLBUTRIN XL) 150 MG 24 hr tablet Take 150 mg by mouth daily.  (Patient not taking: Reported on 03/15/2023)     escitalopram (LEXAPRO) 10 MG tablet Take 10 mg by mouth daily. (Patient not taking: Reported on 03/15/2023)     No facility-administered medications prior to visit.   Review of Systems  Constitutional:  Negative for chills, diaphoresis, fever, malaise/fatigue and weight loss.  HENT:   Negative for congestion.   Respiratory:  Negative for cough, hemoptysis, sputum production, shortness of breath and wheezing.   Cardiovascular:  Negative for chest pain, palpitations and leg swelling.     Objective:   Vitals:   03/15/23 0923  BP: 122/78  Pulse: 83  SpO2: 99%  Weight: 253 lb 1.6 oz (114.8 kg)  Height: 5' 5.5" (1.664 m)   Physical Exam: General: Well-appearing, no acute distress HENT: [place], AT Eyes: EOMI, no scleral icterus Respiratory: Clear to auscultation bilaterally.  No crackles, wheezing or rales Cardiovascular: RRR, -M/R/G, no JVD Extremities:-Edema,-tenderness Neuro: AAO x4, CNII-XII grossly intact Psych: Normal mood, normal affect  Data Reviewed:  Chest imaging: CXR 01/27/17 - No acute abnormalities. No edema, effusion or infiltrate  PFT:  11/29/2017 Interpretation: On ICS inhaler. Mild obstructive defect with FEV1 102%.  No significant bronchodilator effect.  Normal lung volumes and DLCO.  12/30/21 FVC 3.77 (97%) FEV1 3.20 (98%) Ratio 85  TLC 93% DLCO 113%. Partial bronchodilator response however not significant. This does not preclude inhaler if benefit bronchodilator perceived Interpretation: Normal spirometry on ICS/LABA   Labs: CBC    Component Value Date/Time   WBC 5.5 12/30/2021 1040   WBC 5.7 08/22/2021 0635   RBC 4.12 12/30/2021 1040   RBC 4.26 08/22/2021 0635   HGB 12.3 12/30/2021 1040   HCT 37.7 12/30/2021 1040   PLT 380 08/22/2021 0635   MCV 92 12/30/2021 1040   MCH 29.9 12/30/2021 1040   MCH 30.5 08/22/2021 0635   MCHC 32.6 12/30/2021 1040   MCHC 33.9 08/22/2021 0635   RDW 12.6 12/30/2021 1040   LYMPHSABS 1.8 12/30/2021 1040   MONOABS 0.6 11/17/2017 1006   EOSABS 0.0 12/30/2021 1040   BASOSABS 0.0 12/30/2021 1040   Absolute eos 11/17/17 - 600 Absolute eos 12/30/21 - 0   Assessment & Plan:  33 year old female with hx of childhood asthma and allergic rhinitis who presents for follow-up for severe persistent asthma. Fair  control when able to receive Dupixent with no exacerbations requiring prednisone since our last visit. Compliant with bronchodilators.   Previously on Fasenra 01/2018-12/2021. Failed weaning in 08/2020 with exacerbation On Dupixent 12/2021. Currently barrier due to access from lack of insurance provided at her current job  Severe Persistent Asthma - well controlled on Dupixent and inhalers CHANGE to Wixela 250-50 mcg ONE puff in the morning and evening. Rinse mouth  out after use CONTINUE Albuterol every 4 hours as needed for shortness of breath or wheezing CONTINUE Singulair 10 mg daily.  CONTINUE Zyrtec daily CONTINUE Dupixent  injections as scheduled. Working with pharmacy team for approval for financial assistance CONTINUE spacer to use   Asthma Action Plan Increase Dulera to 2 puffs three times a day for worsening shortness of breath, wheezing and cough. If you symptoms do not improve in 24-48 hours, please our office for evaluation and/or prednisone taper.  Immunization History  Administered Date(s) Administered   Influenza Split 10/17/2018   Influenza Whole 10/17/2018   Influenza,inj,Quad PF,6-35 Mos 01/05/2020   Influenza-Unspecified 01/05/2020   Td 01/28/2004   Meds ordered this encounter  Medications   fluticasone-salmeterol (WIXELA INHUB) 250-50 MCG/ACT AEPB    Sig: Inhale 1 puff into the lungs in the morning and at bedtime.    Dispense:  60 each    Refill:  5   albuterol (VENTOLIN HFA) 108 (90 Base) MCG/ACT inhaler    Sig: Inhale 2 puffs into the lungs every 6 (six) hours as needed for wheezing or shortness of breath.    Dispense:  1 each    Refill:  5   No orders of the defined types were placed in this encounter.  Return in about 8 months (around 11/12/2023).  I have spent a total time of 35-minutes on the day of the appointment including chart review, data review, collecting history, coordinating care and discussing medical diagnosis and plan with the  patient/family. Past medical history, allergies, medications were reviewed. Pertinent imaging, labs and tests included in this note have been reviewed and interpreted independently by me.  Mechele Collin, MD [facility] Pulmonary/Critical Care Medicine 03/15/2023 9:46 AM

## 2023-03-15 NOTE — Patient Instructions (Signed)
 Severe Persistent Asthma - well controlled on Dupixent and inhalers CHANGE to Wixela 250-50 mcg ONE puff in the morning and evening. Rinse mouth out after use CONTINUE Albuterol every 4 hours as needed for shortness of breath or wheezing CONTINUE Singulair 10 mg daily.  CONTINUE Zyrtec daily CONTINUE Dupixent  injections as scheduled CONTINUE spacer to use

## 2023-03-31 NOTE — Telephone Encounter (Signed)
 Per FRM, [employer] is waiting for proof of income to be submitted. Reached out to pt and she states that no one from [employer] has contacted her. Pt verbalizes understanding, she will send the required documentation to me via email.

## 2023-04-02 NOTE — Telephone Encounter (Signed)
 Proof of income received and submitted to [facility]. Will await f/u.

## 2023-04-05 ENCOUNTER — Encounter (HOSPITAL_BASED_OUTPATIENT_CLINIC_OR_DEPARTMENT_OTHER): Payer: Self-pay | Admitting: Pulmonary Disease

## 2023-04-07 ENCOUNTER — Ambulatory Visit (HOSPITAL_BASED_OUTPATIENT_CLINIC_OR_DEPARTMENT_OTHER): Payer: Self-pay | Admitting: Pulmonary Disease

## 2023-04-12 NOTE — Telephone Encounter (Signed)
 Received a fax from  [employer] regarding an approval for DUPIXENT patient assistance from 04/09/2023 to 04/08/2024. Approval letter sent to scan center.  Ref#: [id number]   Called and discussed with pt, she states that she has already spoken with a [employer] rep and medication delivery has been scheduled. Nothing further needed at this time.

## 2023-05-17 ENCOUNTER — Encounter (HOSPITAL_BASED_OUTPATIENT_CLINIC_OR_DEPARTMENT_OTHER): Payer: Self-pay | Admitting: Pulmonary Disease

## 2023-05-17 MED ORDER — AMOXICILLIN-POT CLAVULANATE 875-125 MG PO TABS: 1.0000 | ORAL_TABLET | Freq: Two times a day (BID) | ORAL | 0 refills | Status: DC

## 2023-05-17 NOTE — Telephone Encounter (Signed)
**Note De-identified  [place] Obfuscation** Please advise 

## 2023-06-27 ENCOUNTER — Encounter (HOSPITAL_BASED_OUTPATIENT_CLINIC_OR_DEPARTMENT_OTHER): Payer: Self-pay | Admitting: Pulmonary Disease

## 2023-06-28 NOTE — Telephone Encounter (Signed)
 Please advise in Dr Washington Hacker absence

## 2023-11-12 ENCOUNTER — Ambulatory Visit (HOSPITAL_BASED_OUTPATIENT_CLINIC_OR_DEPARTMENT_OTHER): Payer: Self-pay | Admitting: Pulmonary Disease

## 2023-11-12 ENCOUNTER — Encounter (HOSPITAL_BASED_OUTPATIENT_CLINIC_OR_DEPARTMENT_OTHER): Payer: Self-pay

## 2023-11-14 ENCOUNTER — Encounter (HOSPITAL_BASED_OUTPATIENT_CLINIC_OR_DEPARTMENT_OTHER): Payer: Self-pay | Admitting: Emergency Medicine

## 2023-11-14 ENCOUNTER — Emergency Department (HOSPITAL_BASED_OUTPATIENT_CLINIC_OR_DEPARTMENT_OTHER)
Admission: EM | Admit: 2023-11-14 | Discharge: 2023-11-14 | Disposition: A | Discharge status: Home / Self Care | Payer: Self-pay | Attending: Emergency Medicine | Admitting: Emergency Medicine

## 2023-11-14 ENCOUNTER — Emergency Department (HOSPITAL_BASED_OUTPATIENT_CLINIC_OR_DEPARTMENT_OTHER): Payer: Self-pay | Admitting: Radiology

## 2023-11-14 DIAGNOSIS — S93402A Sprain of unspecified ligament of left ankle, initial encounter: Secondary | ICD-10-CM | POA: Insufficient documentation

## 2023-11-14 DIAGNOSIS — X509XXA Other and unspecified overexertion or strenuous movements or postures, initial encounter: Secondary | ICD-10-CM | POA: Insufficient documentation

## 2023-11-14 NOTE — Discharge Instructions (Signed)
 Recommend 400 mg ibuprofen  every 8 hours as needed for pain.  Recommend 1000 mg of Tylenol every 6 hours as needed for pain.  Recommend ice 10 to 15 minutes on and repeat as you can tolerate.  Use walking boot for comfort.  Activity as tolerated.  Follow-up with your primary care doctor or orthopedics.

## 2023-11-14 NOTE — ED Notes (Signed)
Discharge instructions, follow up care, and pain management reviewed and explained, pt verbalized understanding and had no further questions on d/c.  

## 2023-11-14 NOTE — ED Provider Notes (Signed)
 Alisha Harris AT [facility] Provider Note   CSN: [id number] Arrival date & time: 11/14/23  1303     Patient presents with: Ankle Pain   Alisha Harris is a 33 y.o. female.   Patient here with left ankle pain after rolling ankle.  Nuys any weakness numbness tingling.  No pain elsewhere.  No prior injury to this area.  No significant medical history.  The history is provided by the patient.       Prior to Admission medications   Medication Sig Start Date End Date Taking? Authorizing Provider  albuterol  (VENTOLIN  HFA) 108 (90 Base) MCG/ACT inhaler Inhale 2 puffs into the lungs every 6 (six) hours as needed for wheezing or shortness of breath. 03/15/23   Kassie Acquanetta Bradley, MD  amoxicillin -clavulanate (AUGMENTIN ) 875-125 MG tablet Take 1 tablet by mouth 2 (two) times daily. 05/17/23   Kassie Acquanetta Bradley, MD  amphetamine-dextroamphetamine (ADDERALL) 20 MG tablet Take 20 mg by mouth 2 (two) times daily.  11/20/16   [provider]  buPROPion (WELLBUTRIN XL) 150 MG 24 hr tablet Take 150 mg by mouth daily.  Patient not taking: Reported on 03/15/2023 08/26/16   [provider]  cetirizine (ZYRTEC) 10 MG tablet Take 10 mg by mouth daily.     [provider]  Dupilumab  (DUPIXENT ) 300 MG/2ML SOPN Inject 300 mg into the skin every 14 (fourteen) days. 07/13/22   Kassie Acquanetta Bradley, MD  escitalopram (LEXAPRO) 10 MG tablet Take 10 mg by mouth daily. Patient not taking: Reported on 03/15/2023 11/22/20   [provider]  fluticasone -salmeterol (WIXELA INHUB) 250-50 MCG/ACT AEPB Inhale 1 puff into the lungs in the morning and at bedtime. 03/15/23   Kassie Acquanetta Bradley, MD  montelukast  (SINGULAIR ) 10 MG tablet Take 1 tablet (10 mg total) by mouth at bedtime. 03/15/23   Kassie Acquanetta Bradley, MD  paragard intrauterine copper IUD IUD by Intrauterine route.    [provider]  paragard intrauterine copper IUD IUD by Intrauterine route. 04/24/19    [provider]  Spacer/Aero-Holding Raguel DEVI 1 Device by Does not apply route as needed. 09/06/20   Kassie Acquanetta Bradley, MD  vitamin B-12 (CYANOCOBALAMIN) 500 MCG tablet Take by mouth.    [provider]    Allergies: Patient has no known allergies.    Review of Systems  Updated Vital Signs BP 111/84 (BP Location: Right Arm)   Pulse 90   Temp 98.2 F (36.8 C) (Oral)   Resp 17   LMP 11/01/2023 (Approximate)   SpO2 100%   Physical Exam Vitals and nursing note reviewed.  Constitutional:      General: She is not in acute distress.    Appearance: She is well-developed.  HENT:     Head: Normocephalic and atraumatic.  Eyes:     Conjunctiva/sclera: Conjunctivae normal.  Cardiovascular:     Rate and Rhythm: Normal rate and regular rhythm.     Pulses: Normal pulses.     Heart sounds: No murmur heard. Pulmonary:     Effort: Pulmonary effort is normal. No respiratory distress.     Breath sounds: Normal breath sounds.  Abdominal:     Palpations: Abdomen is soft.     Tenderness: There is no abdominal tenderness.  Musculoskeletal:        General: Tenderness present. No swelling.     Cervical back: Neck supple.     Comments: Tenderness to the left ankle with no obvious deformity, no tenderness elsewhere  Skin:    General: Skin is warm and dry.     Capillary Refill: Capillary refill takes less than 2 seconds.  Neurological:     General: No focal deficit present.     Mental Status: She is alert and oriented to person, place, and time.     Sensory: No sensory deficit.     Motor: No weakness.  Psychiatric:        Mood and Affect: Mood normal.     (all labs ordered are listed, but only abnormal results are displayed) Labs Reviewed - No data to display  EKG: None  Radiology: DG Ankle Complete Left Result Date: 11/14/2023 CLINICAL DATA:  Ankle injury, fell off sidewalk. EXAM: LEFT ANKLE COMPLETE - 3+ VIEW COMPARISON:  None Available. FINDINGS: There is no  evidence of acute fracture or dislocation. Moderate calcaneal spurring is noted. Soft tissue swelling is present about the ankle most pronounced over the lateral malleolus. IMPRESSION: No acute fracture or dislocation. Electronically Signed   By: Leita Birmingham M.D.   On: 11/14/2023 14:06     Procedures   Medications Ordered in the ED - No data to display                                  Medical Decision Making Amount and/or Complexity of Data Reviewed Radiology: ordered.   GINAMARIE BANFIELD is here with left ankle pain.  Rolled her ankle prior to coming here.  She got swelling over the left lateral malleolus.  X-ray per radiology report with no acute findings.  Overall suspect ankle sprain.  No features of fracture on x-ray.  She is neurovascular neuromuscular intact on exam.  Recommend Tylenol ibuprofen  ice and rest.  Placed in a walking boot.  Follow-up with orthopedics or primary care.  Discharge.  This chart was dictated using voice recognition software.  Despite best efforts to proofread,  errors can occur which can change the documentation meaning.      Final diagnoses:  Sprain of left ankle, unspecified ligament, initial encounter    ED Discharge Orders     None          Ruthe Cornet, DO 11/14/23 1610

## 2024-01-25 ENCOUNTER — Encounter (HOSPITAL_BASED_OUTPATIENT_CLINIC_OR_DEPARTMENT_OTHER): Payer: Self-pay | Admitting: Pulmonary Disease

## 2024-01-25 DIAGNOSIS — J4550 Severe persistent asthma, uncomplicated: Secondary | ICD-10-CM

## 2024-01-25 MED ORDER — DUPIXENT 300 MG/2ML ~~LOC~~ SOAJ: 300.0000 mg | SUBCUTANEOUS | 0 refills | Status: AC

## 2024-01-25 NOTE — Telephone Encounter (Signed)
Dupixent injection

## 2024-01-28 ENCOUNTER — Ambulatory Visit (HOSPITAL_BASED_OUTPATIENT_CLINIC_OR_DEPARTMENT_OTHER): Payer: Self-pay | Admitting: Pulmonary Disease

## 2024-02-18 ENCOUNTER — Encounter (HOSPITAL_BASED_OUTPATIENT_CLINIC_OR_DEPARTMENT_OTHER): Payer: Self-pay | Admitting: Pulmonary Disease

## 2024-02-18 ENCOUNTER — Ambulatory Visit (INDEPENDENT_AMBULATORY_CARE_PROVIDER_SITE_OTHER): Payer: Self-pay | Admitting: Pulmonary Disease

## 2024-02-18 ENCOUNTER — Telehealth: Payer: Self-pay

## 2024-02-18 VITALS — BP 138/76 | HR 74 | Ht 65.5 in | Wt 268.7 lb

## 2024-02-18 DIAGNOSIS — Z87891 Personal history of nicotine dependence: Secondary | ICD-10-CM

## 2024-02-18 DIAGNOSIS — J4550 Severe persistent asthma, uncomplicated: Secondary | ICD-10-CM

## 2024-02-18 NOTE — Telephone Encounter (Signed)
 Current Dupixent  PAP good through 04/08/24.   Messaged patient with steps to renew as this is handled between [employer] and the patient.

## 2024-02-18 NOTE — Progress Notes (Signed)
 "  Synopsis: Referred in 10/2017 for severe persistent asthma  Subjective:   PATIENT ID: Alisha Harris GENDER: female DOB: Apr 27, 1990, MRN: [id number]   HPI  Chief Complaint  Patient presents with   Asthma   Alisha Harris is a 34 year old female with history of childhood asthma, allergic rhinitis who presents for follow-up of severe persistent asthma. Started on Fasenra  in 01/2018. Switched to Dup[employer]ent  in 12/2021.  Synopsis: Started Fasenra  01/2018 with no exacerbation since enrollment. For allergies, she takes with montelukast  and zyrtec. Symptoms are usually triggered by seasonal allergies, animal dander, grass, mildew and illness.  2022 - Treated for exacerbation after missing dose of Fasenra  on vacation 2023 - Well controlled on Fasenra . Had to swtich to Dupixent  due to insurance denial in November. Started on December. 2024 - Controlled on Dupixent  but not as well controlled compared to Fasenra . Faced difficulty with insurance/lack of insurance in obtaining 2025  Well controlled on Dupixent . Treated for sinusitis in April  03/15/23 Since our last visit she is overall doing well when she is on her Dupixent . She will notice symptoms recur 2-3 days before her next dose with fatigue and shortness of breath. Access to medication is the main barrier.  02/18/24 Since our last visit she has been overall doing well. Compliant with Dupixent  and Dulera . Triggered with environmental factors including recently painting her house but no exacerbations.   Asthma Control Test ACT Total Score  02/18/2024 11:29 AM 23  03/15/2023  9:45 AM 24  06/27/2021  1:36 PM 24   Current asthma medications: Dulera  200-5 2 puffs twice daily Montelukast  10 mg daily Zyertec daily PRN albuterol   Prior medications: Symbicort - no improvement per patient  Steroid Hx:  2018- October XX 2019 Jan Feb Mar April May June July Aug Sept Oct Nov Dec   OLEGARIO OLEGARIO OLEGARIO XXX    2020 Jan Feb Mar April May June July  Aug Sept Oct Nov Dec                 2021 Jan Feb March April May June July Aug Sept Oct Nov Dec                2022 Jan Feb March April May June July Aug Sept Oct Nov Dec          X       2023 Jan Feb Mar April May June July Aug Sept Oct Nov Dec                2024 Jan Feb Mar April May June July Aug Sept Oct Nov Dec                2025 Jan Feb Mar April May June July Aug Sept Oct Nov Dec                 Age of tobacco initiation: 34 years old  Age of tobacco cessation: 34 years old Average packs/day: 1/2 ppd  Social History: Currently sales executive Previously worked in office for [employer] She is a interior and spatial designer on weekends   Past Medical History:  Diagnosis Date   Anxiety    Asthma     No Known Allergies   Outpatient Medications Prior to Visit  Medication Sig Dispense Refill   amphetamine-dextroamphetamine (ADDERALL) 20 MG tablet Take 20 mg by mouth 2 (two) times daily.      Dupilumab  (DUPIXENT ) 300 MG/2ML SOAJ Inject 300 mg  into the skin every 14 (fourteen) days. **PT ASSISTANCE - CONTINUATION** 8 mL 0   mometasone -formoterol  (DULERA ) 100-5 MCG/ACT AERO Inhale 2 puffs into the lungs 2 (two) times daily.     montelukast  (SINGULAIR ) 10 MG tablet Take 1 tablet (10 mg total) by mouth at bedtime.     paragard intrauterine copper IUD IUD by Intrauterine route.     vitamin B-12 (CYANOCOBALAMIN) 500 MCG tablet Take by mouth.     albuterol  (VENTOLIN  HFA) 108 (90 Base) MCG/ACT inhaler Inhale 2 puffs into the lungs every 6 (six) hours as needed for wheezing or shortness of breath. (Patient not taking: Reported on 02/18/2024) 1 each 5   paragard intrauterine copper IUD IUD by Intrauterine route.     Spacer/Aero-Holding Chambers DEVI 1 Device by Does not apply route as needed. 1 each 0   cetirizine (ZYRTEC) 10 MG tablet Take 10 mg by mouth daily.  (Patient not taking: Reported on 02/18/2024)     fluticasone -salmeterol (WIXELA INHUB) 250-50 MCG/ACT AEPB Inhale 1 puff into the lungs in  the morning and at bedtime. (Patient not taking: Reported on 02/18/2024) 60 each 5   No facility-administered medications prior to visit.   Review of Systems  Constitutional:  Negative for chills, diaphoresis, fever, malaise/fatigue and weight loss.  HENT:  Negative for congestion.   Respiratory:  Negative for cough, hemoptysis, sputum production, shortness of breath and wheezing.   Cardiovascular:  Negative for chest pain, palpitations and leg swelling.     Objective:   Vitals:   02/18/24 1126  BP: 138/76  Pulse: 74  SpO2: 96%  Weight: 268 lb 11.2 oz (121.9 kg)  Height: 5' 5.5 (1.664 m)   Physical Exam: General: Well-appearing, no acute distress HENT: [place], AT Eyes: EOMI, no scleral icterus Respiratory: Clear to auscultation bilaterally.  No crackles, wheezing or rales Cardiovascular: RRR, -M/R/G, no JVD Extremities:-Edema,-tenderness Neuro: AAO x4, CNII-XII grossly intact Psych: Normal mood, normal affect  Data Reviewed:  Chest imaging: CXR 01/27/17 - No acute abnormalities. No edema, effusion or infiltrate  PFT:  11/29/2017 Interpretation: On ICS inhaler. Mild obstructive defect with FEV1 102%.  No significant bronchodilator effect.  Normal lung volumes and DLCO.  12/30/21 FVC 3.77 (97%) FEV1 3.20 (98%) Ratio 85  TLC 93% DLCO 113%. Partial bronchodilator response however not significant. This does not preclude inhaler if benefit bronchodilator perceived Interpretation: Normal spirometry on ICS/LABA   Labs: CBC    Component Value Date/Time   WBC 5.5 12/30/2021 1040   WBC 5.7 08/22/2021 0635   RBC 4.12 12/30/2021 1040   RBC 4.26 08/22/2021 0635   HGB 12.3 12/30/2021 1040   HCT 37.7 12/30/2021 1040   PLT 380 08/22/2021 0635   MCV 92 12/30/2021 1040   MCH 29.9 12/30/2021 1040   MCH 30.5 08/22/2021 0635   MCHC 32.6 12/30/2021 1040   MCHC 33.9 08/22/2021 0635   RDW 12.6 12/30/2021 1040   LYMPHSABS 1.8 12/30/2021 1040   MONOABS 0.6 11/17/2017 1006   EOSABS 0.0  12/30/2021 1040   BASOSABS 0.0 12/30/2021 1040   Absolute eos 11/17/17 - 600 Absolute eos 12/30/21 - 0   Assessment & Plan:  34 year old female with hx of childhood asthma and allergic rhinitis who presents for follow-up severe persistent asthma. Good control on current regimen.  Previously on Fasenra  01/2018-12/2021. Failed weaning in 08/2020 with exacerbation On Dupixent  12/2021. Currently barrier due to access from lack of insurance provided at her current job  Severe Persistent Asthma - well controlled  on Dupixent  and inhalers CONTINUE Dulera  100-5 mcg TWO puffs TWICE a day Rinse mouth out after use CONTINUE Singulair  10 mg daily.  CONTINUE Zyrtec daily CONTINUE Dupixent  as scheduled. Ensure renewal is up to date  Asthma Action Plan Increase Dulera  to 2 puffs three times a day for worsening shortness of breath, wheezing and cough. If you symptoms do not improve in 24-48 hours, please our office for evaluation and/or prednisone  taper.  Immunization History  Administered Date(s) Administered   Influenza Split 10/17/2018   Influenza Whole 10/17/2018   Influenza,inj,Quad PF,6-35 Mos 01/05/2020   Influenza-Unspecified 01/05/2020   Td 01/28/2004   No orders of the defined types were placed in this encounter.  No orders of the defined types were placed in this encounter.  Return in about 6 months (around 08/17/2024).  I have spent a total time of 30-minutes on the day of the appointment including chart review, data review, collecting history, coordinating care and discussing medical diagnosis and plan with the patient/family. Past medical history, allergies, medications were reviewed. Pertinent imaging, labs and tests included in this note have been reviewed and interpreted independently by me.  Slater Staff, MD [facility] Pulmonary/Critical Care Medicine 02/18/2024 12:27 PM     "

## 2024-02-18 NOTE — Patient Instructions (Signed)
 Severe Persistent Asthma - well controlled on Dupixent  and inhalers CONTINUE Dulera  100-5 mcg TWO puffs TWICE a day Rinse mouth out after use CONTINUE Singulair  10 mg daily.  CONTINUE Zyrtec daily CONTINUE Dupixent  as scheduled. Ensure renewal is up to date

## 2024-02-21 ENCOUNTER — Other Ambulatory Visit: Payer: Self-pay
